# Patient Record
Sex: Female | Born: 1990 | Race: White | Hispanic: No | Marital: Single | Smoking: Never smoker
Health system: Southern US, Community
[De-identification: ages and names within clinical notes are randomized; demographics above are authoritative.]

## PROBLEM LIST (undated history)

## (undated) DIAGNOSIS — D649 Anemia, unspecified: Secondary | ICD-10-CM

## (undated) DIAGNOSIS — G43909 Migraine, unspecified, not intractable, without status migrainosus: Secondary | ICD-10-CM

## (undated) DIAGNOSIS — F329 Major depressive disorder, single episode, unspecified: Secondary | ICD-10-CM

## (undated) DIAGNOSIS — F32A Depression, unspecified: Secondary | ICD-10-CM

## (undated) DIAGNOSIS — J42 Unspecified chronic bronchitis: Secondary | ICD-10-CM

## (undated) DIAGNOSIS — F419 Anxiety disorder, unspecified: Secondary | ICD-10-CM

## (undated) DIAGNOSIS — Z8619 Personal history of other infectious and parasitic diseases: Secondary | ICD-10-CM

## (undated) DIAGNOSIS — M1990 Unspecified osteoarthritis, unspecified site: Secondary | ICD-10-CM

## (undated) DIAGNOSIS — Z8669 Personal history of other diseases of the nervous system and sense organs: Secondary | ICD-10-CM

## (undated) HISTORY — PX: TONSILLECTOMY: SUR1361

## (undated) HISTORY — DX: Migraine, unspecified, not intractable, without status migrainosus: G43.909

## (undated) HISTORY — DX: Unspecified osteoarthritis, unspecified site: M19.90

## (undated) HISTORY — DX: Unspecified chronic bronchitis: J42

## (undated) HISTORY — DX: Anemia, unspecified: D64.9

## (undated) HISTORY — DX: Depression, unspecified: F32.A

## (undated) HISTORY — PX: ADENOIDECTOMY: SUR15

## (undated) HISTORY — PX: TYMPANOSTOMY TUBE PLACEMENT: SHX32

## (undated) HISTORY — DX: Major depressive disorder, single episode, unspecified: F32.9

## (undated) HISTORY — DX: Personal history of other infectious and parasitic diseases: Z86.19

---

## 2012-12-03 ENCOUNTER — Emergency Department (HOSPITAL_BASED_OUTPATIENT_CLINIC_OR_DEPARTMENT_OTHER): Payer: BC Managed Care – PPO

## 2012-12-03 ENCOUNTER — Emergency Department (HOSPITAL_BASED_OUTPATIENT_CLINIC_OR_DEPARTMENT_OTHER)
Admission: EM | Admit: 2012-12-03 | Discharge: 2012-12-03 | Disposition: A | Discharge status: Home / Self Care | Payer: BC Managed Care – PPO | Attending: Emergency Medicine | Admitting: Emergency Medicine

## 2012-12-03 ENCOUNTER — Encounter (HOSPITAL_BASED_OUTPATIENT_CLINIC_OR_DEPARTMENT_OTHER): Payer: Self-pay | Admitting: *Deleted

## 2012-12-03 DIAGNOSIS — M79609 Pain in unspecified limb: Secondary | ICD-10-CM | POA: Insufficient documentation

## 2012-12-03 DIAGNOSIS — R269 Unspecified abnormalities of gait and mobility: Secondary | ICD-10-CM | POA: Insufficient documentation

## 2012-12-03 DIAGNOSIS — M79671 Pain in right foot: Secondary | ICD-10-CM

## 2012-12-03 DIAGNOSIS — Z880 Allergy status to penicillin: Secondary | ICD-10-CM | POA: Insufficient documentation

## 2012-12-03 MED ORDER — OXYCODONE-ACETAMINOPHEN 5-325 MG PO TABS: 1.0000 | ORAL_TABLET | Freq: Four times a day (QID) | ORAL | Status: DC | PRN

## 2012-12-03 NOTE — ED Notes (Signed)
Pt reports injuring (R) foot about a month ago.  Denies ankle pain.  Reports pain in top of foot. Pt ambulatory with a slight limp, states that she has been ace wrapping foot.  No deformity, swelling or bruising noted.

## 2012-12-03 NOTE — ED Provider Notes (Signed)
   History    CSN: [id number] Arrival date & time 12/03/12  4098  First MD Initiated Contact with Patient 12/03/12 [phone]     Chief Complaint  Patient presents with  . Foot Pain   (Consider location/radiation/quality/duration/timing/severity/associated sxs/prior Treatment) Patient is a 22 y.o. female presenting with lower extremity pain. The history is provided by the patient.  Foot Pain   patient with pain in her right foot for the last 2 weeks to month. Getting worse. Worse with walking. Her some swelling. No trauma. No fevers. No rash. History reviewed. No pertinent past medical history. Past Surgical History  Procedure Laterality Date  . Tonsillectomy     History reviewed. No pertinent family history. History  Substance Use Topics  . Smoking status: Never Smoker   . Smokeless tobacco: Not on file  . Alcohol Use: Yes     Comment: socially   OB History   Grav Para Term Preterm Abortions TAB SAB Ect Mult Living                 Review of Systems  Constitutional: Negative for fever.  Musculoskeletal: Positive for gait problem. Negative for myalgias and arthralgias.       Patient states she's had to drag her foot.  Skin: Negative for rash and wound.  Neurological: Negative for weakness and numbness.    Allergies  Penicillins  Home Medications   Current Outpatient Rx  Name  Route  Sig  Dispense  Refill  . oxyCODONE-acetaminophen (PERCOCET/ROXICET) 5-325 MG per tablet   Oral   Take 1-2 tablets by mouth every 6 (six) hours as needed for pain.   15 tablet   0    BP 128/87  Pulse 80  Temp(Src) 98.5 F (36.9 C) (Oral)  Resp 16  Ht 5\' 2"  (1.575 m)  Wt 280 lb (127.007 kg)  BMI 51.2 kg/m2  SpO2 99%  LMP 07/27/2012 Physical Exam  Constitutional:  Patient is obese  HENT:  Head: Normocephalic.  Musculoskeletal: She exhibits tenderness.  Tenderness to the dorsum of right foot near ankle appeared mild swelling. No erythema. No induration. Neurovascular intact  distally. Flexion and extension intact at ankle. Good capillary refill. Palpable dorsalis pedis pulse  Neurological: She is alert.    ED Course  Procedures (including critical care time) Labs Reviewed - No data to display Dg Foot Complete Right  12/03/2012   *RADIOLOGY REPORT*  Clinical Data: Right foot pain  RIGHT FOOT COMPLETE - 3+ VIEW  Comparison: None.  Findings: No fracture or dislocation is noted.  Joint spaces are intact. No soft tissue abnormality is noted.  IMPRESSION: Normal right foot.   Original Report Authenticated By: Lupita Raider.,  M.D.   1. Foot pain, right     MDM  Patient with foot pain. Negative x-ray. Patient was too obese to wear an ASO so Ace wrap was given. Will follow with sports medicine  Juliet Rude. Rubin Payor, MD 12/03/12 [phone]

## 2012-12-04 ENCOUNTER — Encounter: Payer: Self-pay | Admitting: Family Medicine

## 2012-12-04 ENCOUNTER — Ambulatory Visit (INDEPENDENT_AMBULATORY_CARE_PROVIDER_SITE_OTHER): Payer: BC Managed Care – PPO | Admitting: Family Medicine

## 2012-12-04 VITALS — BP 153/103 | HR 85 | Ht 62.0 in | Wt 290.0 lb

## 2012-12-04 DIAGNOSIS — J42 Unspecified chronic bronchitis: Secondary | ICD-10-CM | POA: Insufficient documentation

## 2012-12-04 DIAGNOSIS — F319 Bipolar disorder, unspecified: Secondary | ICD-10-CM | POA: Insufficient documentation

## 2012-12-04 DIAGNOSIS — M79671 Pain in right foot: Secondary | ICD-10-CM

## 2012-12-04 DIAGNOSIS — D649 Anemia, unspecified: Secondary | ICD-10-CM | POA: Insufficient documentation

## 2012-12-04 DIAGNOSIS — M79609 Pain in unspecified limb: Secondary | ICD-10-CM

## 2012-12-04 MED ORDER — HYDROCODONE-ACETAMINOPHEN 5-325 MG PO TABS: 1.0000 | ORAL_TABLET | Freq: Four times a day (QID) | ORAL | Status: DC | PRN

## 2012-12-04 MED ORDER — MELOXICAM 15 MG PO TABS: 15.0000 mg | ORAL_TABLET | Freq: Every day | ORAL | Status: DC

## 2012-12-04 NOTE — Assessment & Plan Note (Signed)
radiographs negative for fracture or other bony abnormality.  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  Start with cam walker (felt very comfortable with this), icing, elevation, meloxicam with norco as needed for severe pain.  Come out of boot to do ROM exercises at least a couple times a day.  Reassess in 1 month.

## 2012-12-04 NOTE — Progress Notes (Signed)
Patient ID: Sheila Donovan, female   DOB: August 02, 1990, 22 y.o.   MRN: [id number]  PCP: No PCP Per Patient  Subjective:   HPI: Patient is a 22 y.o. female here for right foot pain.  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 States was a little sore but pain went away - also felt she dropped this on her toes. It wasn't until a week later that she started developing pain on top of her right foot proximally. Associated with increased swelling, pain with standing and walking - all worse by end of day. Continued to work through this. Has been icing, soaking foot as well. Taking norco as needed. No prior injuries.  Past Medical History  Diagnosis Date  . Chronic bronchitis   . Anemia   . Depression     No current outpatient prescriptions on file prior to visit.   No current facility-administered medications on file prior to visit.    Past Surgical History  Procedure Laterality Date  . Tonsillectomy    . Adenoidectomy    . Tympanostomy tube placement      Allergies  Allergen Reactions  . Penicillins     hives    History   Social History  . Marital Status: Single    Spouse Name: N/A    Number of Children: N/A  . Years of Education: N/A   Occupational History  . Not on file.   Social History Main Topics  . Smoking status: Never Smoker   . Smokeless tobacco: Not on file  . Alcohol Use: Yes     Comment: socially  . Drug Use: No  . Sexually Active: Not on file   Other Topics Concern  . Not on file   Social History Narrative  . No narrative on file    Family History  Problem Relation Age of Onset  . Diabetes Mother   . Hyperlipidemia Mother   . Hypertension Mother   . Heart attack Neg Hx   . Sudden death Neg Hx     BP 153/103  Pulse 85  Ht 5\' 2"  (1.575 m)  Wt 290 lb (131.543 kg)  BMI 53.03 kg/m2  LMP 07/27/2012  Review of Systems: See HPI above.    Objective:  Physical Exam:  Gen:  NAD  R foot/ankle: Mild swelling throughout foot.  No bruising, other deformity. FROM ankle with 5/5 strength all directions - pain greatest with dorsiflexion > plantarflexion reproducing her pain. TTP dorsal foot along extensor tendons.  No malleolar, base 5th, navicular, other foot/ankle TTP. Negative ant drawer and talar tilt.   Negative syndesmotic compression. Thompsons test negative. NV intact distally.    Assessment & Plan:  1. Right foot pain - radiographs negative for fracture or other bony abnormality.  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  Start with cam walker (felt very comfortable with this), icing, elevation, meloxicam with norco as needed for severe pain.  Come out of boot to do ROM exercises at least a couple times a day.  Reassess in 1 month.

## 2012-12-04 NOTE — Patient Instructions (Addendum)
You have an extensor tendinopathy of your right foot/ankle. Rest as much as possible. Icing 15 minutes at a time 3-4 times a day. Elevate above the level of your heart when possible. Soaking in warm water may increase swelling. Meloxicam 15 mg daily with food for pain and inflammation. Norco as needed for severe pain (no driving on this). Wear cam walker for at least a couple weeks. Come out of this a couple times a day to do simple motion exercises - up/downs and alphabet exercises. Follow up with me in about 1 month.

## 2013-01-06 ENCOUNTER — Encounter: Payer: Self-pay | Admitting: Family Medicine

## 2013-01-06 ENCOUNTER — Ambulatory Visit (INDEPENDENT_AMBULATORY_CARE_PROVIDER_SITE_OTHER): Payer: BC Managed Care – PPO | Admitting: Family Medicine

## 2013-01-06 VITALS — BP 156/98 | HR 82 | Ht 62.0 in | Wt 300.0 lb

## 2013-01-06 DIAGNOSIS — M79609 Pain in unspecified limb: Secondary | ICD-10-CM

## 2013-01-06 DIAGNOSIS — M79671 Pain in right foot: Secondary | ICD-10-CM

## 2013-01-06 MED ORDER — TRAMADOL HCL 50 MG PO TABS: 50.0000 mg | ORAL_TABLET | Freq: Three times a day (TID) | ORAL | Status: DC | PRN

## 2013-01-06 NOTE — Patient Instructions (Addendum)
Start physical therapy and do the home exercises on days you don't go. Take meloxicam every day with food. Tramadol as needed for severe pain. Follow up with me in 5-6 weeks for reevaluation.

## 2013-01-07 ENCOUNTER — Encounter: Payer: Self-pay | Admitting: Family Medicine

## 2013-01-07 NOTE — Progress Notes (Signed)
Patient ID: Sheila Donovan, female   DOB: 1991-02-28, 22 y.o.   MRN: [id number]  PCP: No PCP Per Patient  Subjective:   HPI: Patient is a 22 y.o. female here for f/u right foot pain.  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 States was a little sore but pain went away - also felt she dropped this on her toes. It wasn't until a week later that she started developing pain on top of her right foot proximally. Associated with increased swelling, pain with standing and walking - all worse by end of day. Continued to work through this. Has been icing, soaking foot as well. Taking norco as needed. No prior injuries.  8/11: Patient reports she wore the boot for about 4 weeks. Still feels about the same with pain a 7/10. Continues to have the pain on the top of her foot mainly. Takes meloxicam regularly with norco as needed.  Past Medical History  Diagnosis Date  . Chronic bronchitis   . Anemia   . Depression     Current Outpatient Prescriptions on File Prior to Visit  Medication Sig Dispense Refill  . albuterol (PROVENTIL HFA;VENTOLIN HFA) 108 (90 BASE) MCG/ACT inhaler Inhale 2 puffs into the lungs every 6 (six) hours as needed for wheezing.      . meloxicam (MOBIC) 15 MG tablet Take 1 tablet (15 mg total) by mouth daily.  30 tablet  1   No current facility-administered medications on file prior to visit.    Past Surgical History  Procedure Laterality Date  . Tonsillectomy    . Adenoidectomy    . Tympanostomy tube placement      Allergies  Allergen Reactions  . Penicillins     hives    History   Social History  . Marital Status: Single    Spouse Name: N/A    Number of Children: N/A  . Years of Education: N/A   Occupational History  . Not on file.   Social History Main Topics  . Smoking status: Never Smoker   . Smokeless tobacco: Not on file  . Alcohol Use: Yes     Comment: socially  . Drug  Use: No  . Sexually Active: Not on file   Other Topics Concern  . Not on file   Social History Narrative  . No narrative on file    Family History  Problem Relation Age of Onset  . Diabetes Mother   . Hyperlipidemia Mother   . Hypertension Mother   . Heart attack Neg Hx   . Sudden death Neg Hx     BP 156/98  Pulse 82  Ht 5\' 2"  (1.575 m)  Wt 300 lb (136.079 kg)  BMI 54.86 kg/m2  Review of Systems: See HPI above.    Objective:  Physical Exam:  Gen: NAD  R foot/ankle: Mild swelling throughout foot.  No bruising, other deformity. FROM ankle with 5/5 strength all directions - pain greatest with dorsiflexion > plantarflexion reproducing her pain. TTP dorsal foot along extensor tendons.  No malleolar, base 5th, navicular, other foot/ankle TTP. Negative ant drawer and talar tilt.   Negative syndesmotic compression. Thompsons test negative. NV intact distally.    Assessment & Plan:  1. Right foot pain - radiographs negative for fracture or other bony abnormality.  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  Surprised she did not get more relief with cam walker.  Would not expect a stress fracture as she is not physically active (and cam walker should have helped with this).  Discussed options - she will try physical therapy and home exercises as next step.  meloxicam and tramadol.  Consider MRI if still not improving after 5-6 weeks.

## 2013-01-07 NOTE — Assessment & Plan Note (Signed)
radiographs negative for fracture or other bony abnormality.  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  Surprised she did not get more relief with cam walker.  Would not expect a stress fracture as she is not physically active (and cam walker should have helped with this).  Discussed options - she will try physical therapy and home exercises as next step.  meloxicam and tramadol.  Consider MRI if still not improving after 5-6 weeks.

## 2013-01-13 ENCOUNTER — Emergency Department (HOSPITAL_BASED_OUTPATIENT_CLINIC_OR_DEPARTMENT_OTHER)
Admission: EM | Admit: 2013-01-13 | Discharge: 2013-01-13 | Disposition: A | Discharge status: Home / Self Care | Payer: BC Managed Care – PPO | Attending: Emergency Medicine | Admitting: Emergency Medicine

## 2013-01-13 ENCOUNTER — Encounter (HOSPITAL_BASED_OUTPATIENT_CLINIC_OR_DEPARTMENT_OTHER): Payer: Self-pay | Admitting: *Deleted

## 2013-01-13 ENCOUNTER — Other Ambulatory Visit: Payer: Self-pay

## 2013-01-13 DIAGNOSIS — Z862 Personal history of diseases of the blood and blood-forming organs and certain disorders involving the immune mechanism: Secondary | ICD-10-CM | POA: Insufficient documentation

## 2013-01-13 DIAGNOSIS — N938 Other specified abnormal uterine and vaginal bleeding: Secondary | ICD-10-CM | POA: Insufficient documentation

## 2013-01-13 DIAGNOSIS — Z3202 Encounter for pregnancy test, result negative: Secondary | ICD-10-CM | POA: Insufficient documentation

## 2013-01-13 DIAGNOSIS — Z79899 Other long term (current) drug therapy: Secondary | ICD-10-CM | POA: Insufficient documentation

## 2013-01-13 DIAGNOSIS — M545 Low back pain, unspecified: Secondary | ICD-10-CM | POA: Insufficient documentation

## 2013-01-13 DIAGNOSIS — F3289 Other specified depressive episodes: Secondary | ICD-10-CM | POA: Insufficient documentation

## 2013-01-13 DIAGNOSIS — N898 Other specified noninflammatory disorders of vagina: Secondary | ICD-10-CM | POA: Insufficient documentation

## 2013-01-13 DIAGNOSIS — F411 Generalized anxiety disorder: Secondary | ICD-10-CM | POA: Insufficient documentation

## 2013-01-13 DIAGNOSIS — Z8709 Personal history of other diseases of the respiratory system: Secondary | ICD-10-CM | POA: Insufficient documentation

## 2013-01-13 DIAGNOSIS — Z880 Allergy status to penicillin: Secondary | ICD-10-CM | POA: Insufficient documentation

## 2013-01-13 DIAGNOSIS — R109 Unspecified abdominal pain: Secondary | ICD-10-CM | POA: Insufficient documentation

## 2013-01-13 DIAGNOSIS — F329 Major depressive disorder, single episode, unspecified: Secondary | ICD-10-CM | POA: Insufficient documentation

## 2013-01-13 DIAGNOSIS — N949 Unspecified condition associated with female genital organs and menstrual cycle: Secondary | ICD-10-CM | POA: Insufficient documentation

## 2013-01-13 HISTORY — DX: Anxiety disorder, unspecified: F41.9

## 2013-01-13 LAB — URINE MICROSCOPIC-ADD ON

## 2013-01-13 LAB — CBC
Hemoglobin: 11.8 g/dL — ABNORMAL LOW (ref 12.0–15.0)
MCV: 73.6 fL — ABNORMAL LOW (ref 78.0–100.0)
Platelets: 240 10*3/uL (ref 150–400)
RBC: 4.97 MIL/uL (ref 3.87–5.11)
WBC: 9.5 10*3/uL (ref 4.0–10.5)

## 2013-01-13 LAB — WET PREP, GENITAL
Clue Cells Wet Prep HPF POC: NONE SEEN
Trich, Wet Prep: NONE SEEN

## 2013-01-13 LAB — URINALYSIS, ROUTINE W REFLEX MICROSCOPIC
Glucose, UA: NEGATIVE mg/dL
Ketones, ur: 15 mg/dL — AB
Nitrite: NEGATIVE
Specific Gravity, Urine: 1.024 (ref 1.005–1.030)
pH: 5 (ref 5.0–8.0)

## 2013-01-13 LAB — BASIC METABOLIC PANEL
CO2: 25 mEq/L (ref 19–32)
Chloride: 104 mEq/L (ref 96–112)
Creatinine, Ser: 0.7 mg/dL (ref 0.50–1.10)
Glucose, Bld: 92 mg/dL (ref 70–99)

## 2013-01-13 MED ORDER — MEDROXYPROGESTERONE ACETATE 10 MG PO TABS: 10.0000 mg | ORAL_TABLET | Freq: Every day | ORAL | Status: DC

## 2013-01-13 NOTE — ED Notes (Signed)
Patient states she woke up this morning at 0530 and had a right lower back and lower abdominal pain and was covered in vaginal blood.  States she had her normal menstrual cycle approximately two weeks ago.  States she continues to have heavy vaginal bleeding with blood clots.  States she was sitting on the toilet and blew her nose and briefly blacked out and her son was shaking her to wake up and she was lying on the floor.  C/O right lower back pain and lower abdominal pain.

## 2013-01-13 NOTE — ED Provider Notes (Signed)
CSN: [id number]     Arrival date & time 01/13/13  4098 History     First MD Initiated Contact with Patient 01/13/13 [phone]     Chief Complaint  Patient presents with  . Loss of Consciousness   The history is provided by the patient.   patient reports a history of abnormal uterine bleeding.  Her last menstrual cycle was 2 weeks ago.  She woke this morning and had vaginal bleeding and blood in her bed.  This concerned her.  She showered.  She continued to have some heavy bleeding with some blood clots.  She states she was in sitting on a toilet and blowing her nose when she blacked out.  She complains of some lower abdominal cramping and lower back pain.  She states she is currently not 6 reactive.  She denies fevers and chills.  No urinary complaint.  No diarrhea.  She denies that this is rectal bleeding.  She states she's had heavy menstrual cycles before in the past but this is the heaviest.  She does not have an OB/GYN.  Her symptoms are mild to moderate in severity.  No significant pain just some cramping.  Past Medical History  Diagnosis Date  . Chronic bronchitis   . Anemia   . Depression   . Anxiety    Past Surgical History  Procedure Laterality Date  . Tonsillectomy    . Adenoidectomy    . Tympanostomy tube placement     Family History  Problem Relation Age of Onset  . Diabetes Mother   . Hyperlipidemia Mother   . Hypertension Mother   . Heart attack Neg Hx   . Sudden death Neg Hx    History  Substance Use Topics  . Smoking status: Never Smoker   . Smokeless tobacco: Not on file  . Alcohol Use: Yes     Comment: socially   OB History   Grav Para Term Preterm Abortions TAB SAB Ect Mult Living                 Review of Systems  All other systems reviewed and are negative.    Allergies  Penicillins  Home Medications   Current Outpatient Rx  Name  Route  Sig  Dispense  Refill  . albuterol (PROVENTIL HFA;VENTOLIN HFA) 108 (90 BASE) MCG/ACT inhaler    Inhalation   Inhale 2 puffs into the lungs every 6 (six) hours as needed for wheezing.         . medroxyPROGESTERone (PROVERA) 10 MG tablet   Oral   Take 1 tablet (10 mg total) by mouth daily.   10 tablet   0   . meloxicam (MOBIC) 15 MG tablet   Oral   Take 1 tablet (15 mg total) by mouth daily.   30 tablet   1   . traMADol (ULTRAM) 50 MG tablet   Oral   Take 1 tablet (50 mg total) by mouth every 8 (eight) hours as needed for pain.   90 tablet   0    BP 139/93  Pulse 72  Temp(Src) 98.4 F (36.9 C) (Oral)  Resp 20  Ht 5\' 2"  (1.575 m)  Wt 300 lb (136.079 kg)  BMI 54.86 kg/m2  SpO2 100%  LMP 12/30/2012 Physical Exam  Nursing note and vitals reviewed. Constitutional: She is oriented to person, place, and time. She appears well-developed and well-nourished. No distress.  HENT:  Head: Normocephalic and atraumatic.  Eyes: EOM are normal.  Neck: Normal  range of motion.  Cardiovascular: Normal rate, regular rhythm and normal heart sounds.   Pulmonary/Chest: Effort normal and breath sounds normal.  Abdominal: Soft. She exhibits no distension. There is no tenderness.  Morbidly obese  Genitourinary:  Normal external genitalia.  Small amount of blood coming from the cervical os.  Cervical os is closed.  No adnexal masses or fullness.  No uterine masses palpated.  No cervical motion tenderness.  No vaginal lacerations.  Small amount of blood in the vaginal vault.  Musculoskeletal: Normal range of motion.  Neurological: She is alert and oriented to person, place, and time.  Skin: Skin is warm and dry. No pallor.  Psychiatric: She has a normal mood and affect. Judgment normal.    ED Course   Procedures (including critical care time)   Date: 01/13/2013  Rate: 76  Rhythm: normal sinus rhythm  QRS Axis: normal  Intervals: normal  ST/T Wave abnormalities: normal  Conduction Disutrbances: none  Narrative Interpretation:   Old EKG Reviewed: no prior ecg     Labs  Reviewed  URINALYSIS, ROUTINE W REFLEX MICROSCOPIC - Abnormal; Notable for the following:    Color, Urine RED (*)    APPearance TURBID (*)    Hgb urine dipstick LARGE (*)    Bilirubin Urine MODERATE (*)    Ketones, ur 15 (*)    Protein, ur 100 (*)    Leukocytes, UA MODERATE (*)    All other components within normal limits  CBC - Abnormal; Notable for the following:    Hemoglobin 11.8 (*)    MCV 73.6 (*)    MCH 23.7 (*)    RDW 16.8 (*)    All other components within normal limits  URINE MICROSCOPIC-ADD ON - Abnormal; Notable for the following:    Bacteria, UA MANY (*)    All other components within normal limits  GC/CHLAMYDIA PROBE AMP  WET PREP, GENITAL  URINE CULTURE  PREGNANCY, URINE  BASIC METABOLIC PANEL   No results found. 1. DUB (dysfunctional uterine bleeding)     MDM  Her hemoglobin is 11.8.  Vital signs are normal.  No signs of shock.  Urine pregnancy test is negative.  Small amount of ongoing vaginal bleeding. This appears to be dysfunctional uterine ble pelvic exam.  Patient will be prescribed a 10 day course of Provera.  I've instructed the patient to followup with gynecology.  She understands to return to the ER for new or worsening symptoms.  She was [facility] about [facility] in [place] if her symptoms are worsening but she was also told that she is welcome to return to this emergency department if she would prefer.    Lyanne Co, MD 01/13/13 [phone]

## 2013-01-14 ENCOUNTER — Ambulatory Visit: Payer: BC Managed Care – PPO | Attending: Family Medicine | Admitting: Rehabilitation

## 2013-01-14 DIAGNOSIS — M25579 Pain in unspecified ankle and joints of unspecified foot: Secondary | ICD-10-CM | POA: Insufficient documentation

## 2013-01-14 DIAGNOSIS — IMO0001 Reserved for inherently not codable concepts without codable children: Secondary | ICD-10-CM | POA: Insufficient documentation

## 2013-01-14 LAB — URINE CULTURE: Colony Count: 75000

## 2013-01-14 LAB — GC/CHLAMYDIA PROBE AMP
CT Probe RNA: NEGATIVE
GC Probe RNA: NEGATIVE

## 2013-01-22 ENCOUNTER — Ambulatory Visit: Payer: BC Managed Care – PPO | Admitting: Rehabilitation

## 2013-01-28 ENCOUNTER — Ambulatory Visit: Payer: BC Managed Care – PPO | Admitting: Rehabilitation

## 2013-01-30 ENCOUNTER — Ambulatory Visit: Payer: BC Managed Care – PPO | Admitting: Rehabilitation

## 2013-02-04 ENCOUNTER — Ambulatory Visit: Payer: BC Managed Care – PPO | Attending: Family Medicine | Admitting: Rehabilitation

## 2013-02-04 DIAGNOSIS — IMO0001 Reserved for inherently not codable concepts without codable children: Secondary | ICD-10-CM | POA: Insufficient documentation

## 2013-02-04 DIAGNOSIS — M25579 Pain in unspecified ankle and joints of unspecified foot: Secondary | ICD-10-CM | POA: Insufficient documentation

## 2013-02-06 ENCOUNTER — Ambulatory Visit: Payer: BC Managed Care – PPO | Admitting: Rehabilitation

## 2013-02-11 ENCOUNTER — Ambulatory Visit: Payer: BC Managed Care – PPO | Admitting: Rehabilitation

## 2013-02-12 ENCOUNTER — Ambulatory Visit: Payer: BC Managed Care – PPO | Admitting: Family Medicine

## 2013-02-13 ENCOUNTER — Ambulatory Visit: Payer: BC Managed Care – PPO | Admitting: Rehabilitation

## 2013-04-03 ENCOUNTER — Other Ambulatory Visit: Payer: Self-pay

## 2013-08-05 ENCOUNTER — Emergency Department (HOSPITAL_BASED_OUTPATIENT_CLINIC_OR_DEPARTMENT_OTHER)
Admission: EM | Admit: 2013-08-05 | Discharge: 2013-08-05 | Disposition: A | Discharge status: Home / Self Care | Payer: Medicaid Other | Attending: Emergency Medicine | Admitting: Emergency Medicine

## 2013-08-05 ENCOUNTER — Encounter (HOSPITAL_BASED_OUTPATIENT_CLINIC_OR_DEPARTMENT_OTHER): Payer: Self-pay | Admitting: Emergency Medicine

## 2013-08-05 DIAGNOSIS — F3289 Other specified depressive episodes: Secondary | ICD-10-CM | POA: Insufficient documentation

## 2013-08-05 DIAGNOSIS — J069 Acute upper respiratory infection, unspecified: Secondary | ICD-10-CM | POA: Insufficient documentation

## 2013-08-05 DIAGNOSIS — Z791 Long term (current) use of non-steroidal anti-inflammatories (NSAID): Secondary | ICD-10-CM | POA: Insufficient documentation

## 2013-08-05 DIAGNOSIS — Z79899 Other long term (current) drug therapy: Secondary | ICD-10-CM | POA: Insufficient documentation

## 2013-08-05 DIAGNOSIS — Z862 Personal history of diseases of the blood and blood-forming organs and certain disorders involving the immune mechanism: Secondary | ICD-10-CM | POA: Insufficient documentation

## 2013-08-05 DIAGNOSIS — F329 Major depressive disorder, single episode, unspecified: Secondary | ICD-10-CM | POA: Insufficient documentation

## 2013-08-05 DIAGNOSIS — H9209 Otalgia, unspecified ear: Secondary | ICD-10-CM | POA: Insufficient documentation

## 2013-08-05 DIAGNOSIS — F411 Generalized anxiety disorder: Secondary | ICD-10-CM | POA: Insufficient documentation

## 2013-08-05 DIAGNOSIS — Z880 Allergy status to penicillin: Secondary | ICD-10-CM | POA: Insufficient documentation

## 2013-08-05 DIAGNOSIS — Z8709 Personal history of other diseases of the respiratory system: Secondary | ICD-10-CM | POA: Insufficient documentation

## 2013-08-05 HISTORY — DX: Personal history of other diseases of the nervous system and sense organs: Z86.69

## 2013-08-05 MED ORDER — GUAIFENESIN-CODEINE 100-10 MG/5ML PO SOLN: 10.0000 mL | Freq: Four times a day (QID) | ORAL | Status: DC | PRN

## 2013-08-05 NOTE — ED Provider Notes (Signed)
CSN: [id number]     Arrival date & time 08/05/13  1347 History   First MD Initiated Contact with Patient 08/05/13 1407     Chief Complaint  Patient presents with  . URI     (Consider location/radiation/quality/duration/timing/severity/associated sxs/prior Treatment) Patient is a 23 y.o. female presenting with URI. The history is provided by the patient.  URI Presenting symptoms: congestion, cough and ear pain   Severity:  Moderate Onset quality:  Gradual Duration:  3 days Timing:  Constant Progression:  Worsening Chronicity:  New Relieved by:  Nothing Worsened by:  Nothing tried Ineffective treatments:  None tried   Past Medical History  Diagnosis Date  . Chronic bronchitis   . Anemia   . Depression   . Anxiety   . History of ear infections    Past Surgical History  Procedure Laterality Date  . Tonsillectomy    . Adenoidectomy    . Tympanostomy tube placement     Family History  Problem Relation Age of Onset  . Diabetes Mother   . Hyperlipidemia Mother   . Hypertension Mother   . Heart attack Neg Hx   . Sudden death Neg Hx    History  Substance Use Topics  . Smoking status: Never Smoker   . Smokeless tobacco: Not on file  . Alcohol Use: Yes   OB History   Grav Para Term Preterm Abortions TAB SAB Ect Mult Living                 Review of Systems  HENT: Positive for congestion and ear pain.   Respiratory: Positive for cough.   All other systems reviewed and are negative.      Allergies  Penicillins  Home Medications   Current Outpatient Rx  Name  Route  Sig  Dispense  Refill  . albuterol (PROVENTIL HFA;VENTOLIN HFA) 108 (90 BASE) MCG/ACT inhaler   Inhalation   Inhale 2 puffs into the lungs every 6 (six) hours as needed for wheezing.         . medroxyPROGESTERone (PROVERA) 10 MG tablet   Oral   Take 1 tablet (10 mg total) by mouth daily.   10 tablet   0   . meloxicam (MOBIC) 15 MG tablet   Oral   Take 1 tablet (15 mg total) by mouth  daily.   30 tablet   1   . traMADol (ULTRAM) 50 MG tablet   Oral   Take 1 tablet (50 mg total) by mouth every 8 (eight) hours as needed for pain.   90 tablet   0    BP 190/106  Pulse 80  Temp(Src) 98.2 F (36.8 C) (Oral)  Resp 20  Ht 5\' 2"  (1.575 m)  Wt 340 lb (154.223 kg)  BMI 62.17 kg/m2  SpO2 100%  LMP 07/08/2013 Physical Exam  Nursing note and vitals reviewed. Constitutional: She is oriented to person, place, and time. She appears well-developed and well-nourished. No distress.  HENT:  Head: Normocephalic and atraumatic.  Mouth/Throat: Oropharynx is clear and moist.  TMs are clear bilaterally.  Neck: Normal range of motion. Neck supple.  Cardiovascular: Normal rate and regular rhythm.  Exam reveals no gallop and no friction rub.   No murmur heard. Pulmonary/Chest: Effort normal and breath sounds normal. No respiratory distress. She has no wheezes.  Abdominal: Soft. Bowel sounds are normal. She exhibits no distension. There is no tenderness.  Musculoskeletal: Normal range of motion. She exhibits no edema.  Lymphadenopathy:  She has no cervical adenopathy.  Neurological: She is alert and oriented to person, place, and time.  Skin: Skin is warm and dry. She is not diaphoretic.    ED Course  Procedures (including critical care time) Labs Review Labs Reviewed - No data to display Imaging Review No results found.   EKG Interpretation None      MDM   Final diagnoses:  None    Symptoms most likely viral in nature. There is no hypoxia and lungs are clear. There is no evidence for otitis media. We'll recommend over-the-counter medications and when necessary followup. I will prescribe Robitussin with codeine to be used as needed for cough.    Geoffery Lyonsouglas Allen Egerton, MD 08/05/13 [phone]

## 2013-08-05 NOTE — ED Notes (Signed)
C/o cough, sore throat, right ear ache x 3 days

## 2013-08-05 NOTE — Discharge Instructions (Signed)

## 2013-08-19 ENCOUNTER — Emergency Department (HOSPITAL_BASED_OUTPATIENT_CLINIC_OR_DEPARTMENT_OTHER): Payer: Medicaid Other

## 2013-08-19 ENCOUNTER — Emergency Department (HOSPITAL_BASED_OUTPATIENT_CLINIC_OR_DEPARTMENT_OTHER)
Admission: EM | Admit: 2013-08-19 | Discharge: 2013-08-19 | Disposition: A | Discharge status: Home / Self Care | Payer: Medicaid Other | Attending: Emergency Medicine | Admitting: Emergency Medicine

## 2013-08-19 ENCOUNTER — Encounter (HOSPITAL_BASED_OUTPATIENT_CLINIC_OR_DEPARTMENT_OTHER): Payer: Self-pay | Admitting: Emergency Medicine

## 2013-08-19 DIAGNOSIS — F329 Major depressive disorder, single episode, unspecified: Secondary | ICD-10-CM | POA: Insufficient documentation

## 2013-08-19 DIAGNOSIS — Z880 Allergy status to penicillin: Secondary | ICD-10-CM | POA: Insufficient documentation

## 2013-08-19 DIAGNOSIS — F3289 Other specified depressive episodes: Secondary | ICD-10-CM | POA: Insufficient documentation

## 2013-08-19 DIAGNOSIS — Z8669 Personal history of other diseases of the nervous system and sense organs: Secondary | ICD-10-CM | POA: Insufficient documentation

## 2013-08-19 DIAGNOSIS — Z79899 Other long term (current) drug therapy: Secondary | ICD-10-CM | POA: Insufficient documentation

## 2013-08-19 DIAGNOSIS — B9789 Other viral agents as the cause of diseases classified elsewhere: Secondary | ICD-10-CM

## 2013-08-19 DIAGNOSIS — Z791 Long term (current) use of non-steroidal anti-inflammatories (NSAID): Secondary | ICD-10-CM | POA: Insufficient documentation

## 2013-08-19 DIAGNOSIS — J069 Acute upper respiratory infection, unspecified: Secondary | ICD-10-CM | POA: Insufficient documentation

## 2013-08-19 DIAGNOSIS — F411 Generalized anxiety disorder: Secondary | ICD-10-CM | POA: Insufficient documentation

## 2013-08-19 DIAGNOSIS — Z8709 Personal history of other diseases of the respiratory system: Secondary | ICD-10-CM | POA: Insufficient documentation

## 2013-08-19 DIAGNOSIS — Z862 Personal history of diseases of the blood and blood-forming organs and certain disorders involving the immune mechanism: Secondary | ICD-10-CM | POA: Insufficient documentation

## 2013-08-19 LAB — CBC WITH DIFFERENTIAL/PLATELET
Basophils Absolute: 0 10*3/uL (ref 0.0–0.1)
Basophils Relative: 0 % (ref 0–1)
EOS ABS: 0.2 10*3/uL (ref 0.0–0.7)
EOS PCT: 2 % (ref 0–5)
HEMATOCRIT: 41.1 % (ref 36.0–46.0)
HEMOGLOBIN: 12.8 g/dL (ref 12.0–15.0)
LYMPHS ABS: 2.4 10*3/uL (ref 0.7–4.0)
LYMPHS PCT: 25 % (ref 12–46)
MCH: 23.5 pg — AB (ref 26.0–34.0)
MCHC: 31.1 g/dL (ref 30.0–36.0)
MCV: 75.4 fL — AB (ref 78.0–100.0)
MONO ABS: 0.6 10*3/uL (ref 0.1–1.0)
MONOS PCT: 7 % (ref 3–12)
Neutro Abs: 6.3 10*3/uL (ref 1.7–7.7)
Neutrophils Relative %: 66 % (ref 43–77)
PLATELETS: 238 10*3/uL (ref 150–400)
RBC: 5.45 MIL/uL — AB (ref 3.87–5.11)
RDW: 16.4 % — ABNORMAL HIGH (ref 11.5–15.5)
WBC: 9.6 10*3/uL (ref 4.0–10.5)

## 2013-08-19 LAB — RAPID STREP SCREEN (MED CTR MEBANE ONLY): Streptococcus, Group A Screen (Direct): NEGATIVE

## 2013-08-19 MED ORDER — SODIUM CHLORIDE 0.9 % IV BOLUS (SEPSIS)
1000.0000 mL | Freq: Once | INTRAVENOUS | Status: AC
  Administered 2013-08-19: 1000 mL via INTRAVENOUS

## 2013-08-19 MED ORDER — BENZONATATE 100 MG PO CAPS: 200.0000 mg | ORAL_CAPSULE | Freq: Two times a day (BID) | ORAL | Status: DC

## 2013-08-19 MED ORDER — BENZONATATE 100 MG PO CAPS
200.0000 mg | ORAL_CAPSULE | Freq: Once | ORAL | Status: AC
  Administered 2013-08-19: 200 mg via ORAL
  Filled 2013-08-19: qty 2

## 2013-08-19 MED ORDER — ONDANSETRON HCL 4 MG/2ML IJ SOLN
4.0000 mg | Freq: Once | INTRAMUSCULAR | Status: AC
  Administered 2013-08-19: 4 mg via INTRAVENOUS
  Filled 2013-08-19: qty 2

## 2013-08-19 MED ORDER — PREDNISONE 50 MG PO TABS: 50.0000 mg | ORAL_TABLET | Freq: Every day | ORAL | Status: DC

## 2013-08-19 MED ORDER — ALBUTEROL SULFATE (2.5 MG/3ML) 0.083% IN NEBU
2.5000 mg | INHALATION_SOLUTION | Freq: Once | RESPIRATORY_TRACT | Status: AC
Start: 2013-08-19 — End: 2013-08-19
  Administered 2013-08-19: 2.5 mg via RESPIRATORY_TRACT
  Filled 2013-08-19: qty 3

## 2013-08-19 NOTE — ED Notes (Signed)
Resident at bedside. Pt demanding "an inhaler". Resident explaining to pt that she does not feel an inhaled steroid is needed at this time.

## 2013-08-19 NOTE — ED Notes (Signed)
Patient transported to X-ray 

## 2013-08-19 NOTE — ED Notes (Signed)
Pt amb to triage with quick steady gait in nad. Pt reports cough and subjective temps x 2 days. Pt states cough produces yellow sputum. Pt states she does not have a pcp.

## 2013-08-19 NOTE — ED Provider Notes (Signed)
I saw and evaluated the patient, reviewed the resident's note and I agree with the findings and plan. Patient is a 23 year old female presents to the ER with complaints of URI type symptoms. Her cough has been productive of yellow phlegm. This started 2 days ago. Temperatures at home have been up to 102. She was seen here for similar complaints 2 weeks ago however the symptoms resolved. They returned again 2 days ago.  On exam, vitals are stable the patient is afebrile. Head is atraumatic normocephalic. Neck is supple. Heart is regular rate and rhythm. Lungs are clear. Abdomen is benign.  Workup reveals no acute abnormality in the laboratory studies and chest x-ray. She is feeling better with the breathing treatment and appears appropriate for discharge. Symptoms are likely viral in nature. She is to followup if not improving in one week and return if her symptoms worsen or change.   EKG Interpretation None        Geoffery Lyonsouglas Lorik Guo, MD 08/19/13 1221

## 2013-08-19 NOTE — ED Provider Notes (Signed)
CSN: [id number]     Arrival date & time 08/19/13  [id number] History   First MD Initiated Contact with Patient 08/19/13 [phone]     Chief Complaint  Patient presents with  . Cough   HPI Edgar FriskBrittney Bovenzi is 23 y.o. female, presented to the ED with complaints of cough, vomit, fever, headache and sore throat and fatigue. Patient reports her cough has been productive and produces yellow phlegm. She endorses nausea and lack of appetite as well. Start of her symptoms started about one half days ago. She endorses a fever of approximately 102 to Fahrenheit. She denies vomiting and diarrhea. He has not been exposed to any sick contacts. She did get her flu shot this year. Patient is not a smoker.  Past Medical History  Diagnosis Date  . Chronic bronchitis   . Anemia   . Depression   . Anxiety   . History of ear infections    Past Surgical History  Procedure Laterality Date  . Tonsillectomy    . Adenoidectomy    . Tympanostomy tube placement     Family History  Problem Relation Age of Onset  . Diabetes Mother   . Hyperlipidemia Mother   . Hypertension Mother   . Heart attack Neg Hx   . Sudden death Neg Hx    History  Substance Use Topics  . Smoking status: Never Smoker   . Smokeless tobacco: Not on file  . Alcohol Use: Yes   OB History   Grav Para Term Preterm Abortions TAB SAB Ect Mult Living                 Review of Systems  Constitutional: Positive for fever, chills, activity change, appetite change and fatigue.  HENT: Positive for congestion, rhinorrhea, sinus pressure and sore throat. Negative for ear discharge, ear pain, facial swelling, postnasal drip and sneezing.   Eyes: Negative for pain, discharge and itching.  Respiratory: Positive for cough, chest tightness, shortness of breath and wheezing.   Cardiovascular: Negative for chest pain and palpitations.  Gastrointestinal: Positive for nausea. Negative for vomiting, abdominal pain, diarrhea and constipation.  Genitourinary:  Negative for dysuria, frequency, hematuria and flank pain.  Musculoskeletal: Positive for myalgias. Negative for arthralgias.  Skin: Negative for rash.  Neurological: Positive for headaches.   Allergies  Penicillins  Home Medications   Current Outpatient Rx  Name  Route  Sig  Dispense  Refill  . albuterol (PROVENTIL HFA;VENTOLIN HFA) 108 (90 BASE) MCG/ACT inhaler   Inhalation   Inhale 2 puffs into the lungs every 6 (six) hours as needed for wheezing.         . benzonatate (TESSALON PERLES) 100 MG capsule   Oral   Take 2 capsules (200 mg total) by mouth 2 (two) times daily.   20 capsule   0   . guaiFENesin-codeine 100-10 MG/5ML syrup   Oral   Take 10 mLs by mouth every 6 (six) hours as needed for cough.   120 mL   0   . medroxyPROGESTERone (PROVERA) 10 MG tablet   Oral   Take 1 tablet (10 mg total) by mouth daily.   10 tablet   0   . meloxicam (MOBIC) 15 MG tablet   Oral   Take 1 tablet (15 mg total) by mouth daily.   30 tablet   1   . predniSONE (DELTASONE) 50 MG tablet   Oral   Take 1 tablet (50 mg total) by mouth daily with breakfast.  4 tablet   0   . traMADol (ULTRAM) 50 MG tablet   Oral   Take 1 tablet (50 mg total) by mouth every 8 (eight) hours as needed for pain.   90 tablet   0    BP 163/79  Pulse 86  Temp(Src) 98.2 F (36.8 C) (Oral)  Resp 22  Ht 5\' 2"  (1.575 m)  Wt 320 lb (145.151 kg)  BMI 58.51 kg/m2  SpO2 99%  LMP 07/25/2013 Physical Exam Gen: NAD. Persistent cough HEENT: AT. [place]. Bilateral TM visualized and normal in appearance. Bilateral eyes without injections or icterus. MMM. Bilateral nares with mild erythema no swelling. Throat without erythema or exudates.  CV: RRR , no murmurs click gallops or rubs Chest: CTAB, no wheeze or crackles. Normal work of breath Abd: Soft. NTND. BS present. No Masses palpated.  Ext: No erythema. No edema.  Skin: No rashes, purpura or petechiae.  Neuro:  Normal gait. PERLA. EOMi. Alert. Grossly  intact.  Psych: Normal affect, dress, and demeanor. Normal speech  ED Course  Procedures (including critical care time) Labs Review Labs Reviewed  CBC WITH DIFFERENTIAL - Abnormal; Notable for the following:    RBC 5.45 (*)    MCV 75.4 (*)    MCH 23.5 (*)    RDW 16.4 (*)    All other components within normal limits  RAPID STREP SCREEN  PREGNANCY, URINE   Imaging Review Dg Chest 2 View  08/19/2013   CLINICAL DATA:  Cough, congestion  EXAM: CHEST  2 VIEW  COMPARISON:  None.  FINDINGS: Cardiomediastinal silhouette is unremarkable. No acute infiltrate or pulmonary edema. Bony thorax is unremarkable.  IMPRESSION: No active cardiopulmonary disease.   Electronically Signed   By: Natasha Mead M.D.   On: 08/19/2013 08:52     EKG Interpretation None      MDM   Final diagnoses:  Viral URI with cough   Patient presented to the ED with upper respiratory infection symptoms with cough or 1-1/2 days. She appeared in no acute distress and nontoxic. She was afebrile on admission without elevation in white blood cell count. Chest x-ray was normal. Patient was giving an albuterol treatment, Tessalon Perles for cough and Zofran for nausea. IV fluid bolus. Patient responded well with treatment. Patient was given prescriptions for Tessalon Perles and prednisone. Stable for discharge with close followup with PCP if symptoms worsen. She was giving information for available PCP in the area, to start her primary care.    Natalia Leatherwood, DO 08/19/13 [phone]

## 2013-08-19 NOTE — ED Notes (Signed)
Pt requests to speak with resident. "I have many questions she has not answered!" resident alerted.

## 2013-08-19 NOTE — ED Notes (Signed)
Pt angry, states "I am calling my lawyer. I am going to sue her. I need an inhaler, I've had one for the last 6 years and I know I need one."

## 2013-08-19 NOTE — Discharge Instructions (Signed)

## 2014-01-21 ENCOUNTER — Emergency Department (HOSPITAL_BASED_OUTPATIENT_CLINIC_OR_DEPARTMENT_OTHER): Payer: Medicaid Other

## 2014-01-21 ENCOUNTER — Emergency Department (HOSPITAL_BASED_OUTPATIENT_CLINIC_OR_DEPARTMENT_OTHER)
Admission: EM | Admit: 2014-01-21 | Discharge: 2014-01-21 | Disposition: A | Discharge status: Home / Self Care | Payer: Medicaid Other | Attending: Emergency Medicine | Admitting: Emergency Medicine

## 2014-01-21 ENCOUNTER — Encounter (HOSPITAL_BASED_OUTPATIENT_CLINIC_OR_DEPARTMENT_OTHER): Payer: Self-pay | Admitting: Emergency Medicine

## 2014-01-21 DIAGNOSIS — F411 Generalized anxiety disorder: Secondary | ICD-10-CM | POA: Diagnosis not present

## 2014-01-21 DIAGNOSIS — Z862 Personal history of diseases of the blood and blood-forming organs and certain disorders involving the immune mechanism: Secondary | ICD-10-CM | POA: Insufficient documentation

## 2014-01-21 DIAGNOSIS — F3289 Other specified depressive episodes: Secondary | ICD-10-CM | POA: Insufficient documentation

## 2014-01-21 DIAGNOSIS — IMO0002 Reserved for concepts with insufficient information to code with codable children: Secondary | ICD-10-CM | POA: Insufficient documentation

## 2014-01-21 DIAGNOSIS — M25539 Pain in unspecified wrist: Secondary | ICD-10-CM | POA: Diagnosis present

## 2014-01-21 DIAGNOSIS — Z8709 Personal history of other diseases of the respiratory system: Secondary | ICD-10-CM | POA: Diagnosis not present

## 2014-01-21 DIAGNOSIS — G5600 Carpal tunnel syndrome, unspecified upper limb: Secondary | ICD-10-CM | POA: Insufficient documentation

## 2014-01-21 DIAGNOSIS — Z791 Long term (current) use of non-steroidal anti-inflammatories (NSAID): Secondary | ICD-10-CM | POA: Insufficient documentation

## 2014-01-21 DIAGNOSIS — F329 Major depressive disorder, single episode, unspecified: Secondary | ICD-10-CM | POA: Insufficient documentation

## 2014-01-21 DIAGNOSIS — Z880 Allergy status to penicillin: Secondary | ICD-10-CM | POA: Diagnosis not present

## 2014-01-21 DIAGNOSIS — Z79899 Other long term (current) drug therapy: Secondary | ICD-10-CM | POA: Insufficient documentation

## 2014-01-21 DIAGNOSIS — M25531 Pain in right wrist: Secondary | ICD-10-CM

## 2014-01-21 DIAGNOSIS — G5601 Carpal tunnel syndrome, right upper limb: Secondary | ICD-10-CM

## 2014-01-21 MED ORDER — HYDROCODONE-ACETAMINOPHEN 5-325 MG PO TABS: 1.0000 | ORAL_TABLET | Freq: Four times a day (QID) | ORAL | Status: DC | PRN

## 2014-01-21 MED ORDER — NAPROXEN 500 MG PO TABS: 500.0000 mg | ORAL_TABLET | Freq: Two times a day (BID) | ORAL | Status: DC

## 2014-01-21 NOTE — ED Notes (Signed)
Reports numbness and tingling to right wrist since Monday. Also reports bruise to wrist. Cap refill < 2 seconds, pulses present

## 2014-01-21 NOTE — Discharge Instructions (Signed)
Keep the splint in place. Can remove for showering. Take the Naprosyn as directed. Supplement with hydrocodone as needed for additional help with pain. Make an appointment to followup with hand surgery. X-rays of the wrist were negative.

## 2014-01-21 NOTE — ED Provider Notes (Signed)
CSN: [id number]     Arrival date & time 01/21/14  1612 History   First MD Initiated Contact with Patient 01/21/14 1622     Chief Complaint  Patient presents with  . Wrist Pain     (Consider location/radiation/quality/duration/timing/severity/associated sxs/prior Treatment) Patient is a 23 y.o. female presenting with wrist pain. The history is provided by the patient.  Wrist Pain Pertinent negatives include no chest pain, no abdominal pain, no headaches and no shortness of breath.  patient without any direct injury to the right wrist. 2 days ago started with discomfort and swelling in the right wrist area. Now has numbness to the palm index finger and middle finger. Patient denies any repetitive type process or injury. Patient has not had pain like this before. Patient noted increased swelling to the anterior part of the rest. Patient's right-hand dominant.  Past Medical History  Diagnosis Date  . Chronic bronchitis   . Anemia   . Depression   . Anxiety   . History of ear infections    Past Surgical History  Procedure Laterality Date  . Tonsillectomy    . Adenoidectomy    . Tympanostomy tube placement     Family History  Problem Relation Age of Onset  . Diabetes Mother   . Hyperlipidemia Mother   . Hypertension Mother   . Heart attack Neg Hx   . Sudden death Neg Hx    History  Substance Use Topics  . Smoking status: Never Smoker   . Smokeless tobacco: Not on file  . Alcohol Use: Yes   OB History   Grav Para Term Preterm Abortions TAB SAB Ect Mult Living                 Review of Systems  Constitutional: Negative for fever.  HENT: Negative for congestion.   Eyes: Negative for redness.  Respiratory: Negative for shortness of breath.   Cardiovascular: Negative for chest pain.  Gastrointestinal: Negative for nausea, vomiting and abdominal pain.  Musculoskeletal: Negative for neck pain.  Skin: Negative for wound.  Neurological: Positive for numbness. Negative for  headaches.  Hematological: Does not bruise/bleed easily.      Allergies  Penicillins  Home Medications   Prior to Admission medications   Medication Sig Start Date End Date Taking? Authorizing Provider  albuterol (PROVENTIL HFA;VENTOLIN HFA) 108 (90 BASE) MCG/ACT inhaler Inhale 2 puffs into the lungs every 6 (six) hours as needed for wheezing.    Historical Provider, MD  benzonatate (TESSALON PERLES) 100 MG capsule Take 2 capsules (200 mg total) by mouth 2 (two) times daily. 08/19/13   Renee A Kuneff, DO  guaiFENesin-codeine 100-10 MG/5ML syrup Take 10 mLs by mouth every 6 (six) hours as needed for cough. 08/05/13   Geoffery Lyons, MD  HYDROcodone-acetaminophen (NORCO/VICODIN) 5-325 MG per tablet Take 1-2 tablets by mouth every 6 (six) hours as needed for moderate pain. 01/21/14   Vanetta Mulders, MD  medroxyPROGESTERone (PROVERA) 10 MG tablet Take 1 tablet (10 mg total) by mouth daily. 01/13/13   Lyanne Co, MD  meloxicam (MOBIC) 15 MG tablet Take 1 tablet (15 mg total) by mouth daily. 12/04/12   Lenda Kelp, MD  naproxen (NAPROSYN) 500 MG tablet Take 1 tablet (500 mg total) by mouth 2 (two) times daily. 01/21/14   Vanetta Mulders, MD  predniSONE (DELTASONE) 50 MG tablet Take 1 tablet (50 mg total) by mouth daily with breakfast. 08/19/13   Renee A Kuneff, DO  traMADol (ULTRAM) 50 MG  tablet Take 1 tablet (50 mg total) by mouth every 8 (eight) hours as needed for pain. 01/06/13   Lenda Kelp, MD   BP 169/99  Pulse 98  Temp(Src) 97.8 F (36.6 C) (Oral)  Resp 18  Ht  (1.575 m)  Wt 329 lb (149.233 kg)  BMI 60.16 kg/m2  SpO2 99% Physical Exam  Nursing note and vitals reviewed. Constitutional: She is oriented to person, place, and time. She appears well-developed and well-nourished. No distress.  HENT:  Head: Normocephalic and atraumatic.  Mouth/Throat: Oropharynx is clear and moist.  Eyes: Conjunctivae and EOM are normal. Pupils are equal, round, and reactive to light.  Neck:  Normal range of motion.  Cardiovascular: Normal rate, regular rhythm and normal heart sounds.   Pulmonary/Chest: Effort normal and breath sounds normal. No respiratory distress.  Abdominal: Soft. Bowel sounds are normal.  Musculoskeletal:  Swelling to the anterior portion of the right wrist probable early ganglion cyst development. Sensations decreased with some numbness to the palm index finger and middle finger. Ring finger and little finger sensation is normal. Good range of motion. Increased pain with palpation to the carpal tunnel area. Cap refill is 2 seconds and radial pulse is 2+.  Neurological: She is alert and oriented to person, place, and time. No cranial nerve deficit. She exhibits normal muscle tone. Coordination normal.    ED Course  Procedures (including critical care time) Labs Review Labs Reviewed - No data to display  Imaging Review Dg Wrist Complete Right  01/21/2014   CLINICAL DATA:  Right wrist swelling, numbness and tingling for 3 days.  EXAM: RIGHT WRIST - COMPLETE 3+ VIEW  COMPARISON:  None.  FINDINGS: Imaged bones, joints and soft tissues appear normal.  IMPRESSION: Normal study.   Electronically Signed   By: Drusilla Kanner M.D.   On: 01/21/2014 17:09     EKG Interpretation None      MDM   Final diagnoses:  Wrist pain, acute, right  Carpal tunnel syndrome on right    No history of injury to pain to the right wrist with swelling and some carpal tunnel-type sent comes. Capillary refill is intact good range of motion of the fingers. There is some swelling to the anterior portion of the wrist. X-rays are negative. Suspect the swelling in the wrist area resulting in carpal tunnel-type symptoms. We'll treat with a splint and followup with hand surgery. Patient denies any new repetitive type process or injury.    Vanetta Mulders, MD 01/21/14 1728

## 2014-06-08 ENCOUNTER — Observation Stay (HOSPITAL_BASED_OUTPATIENT_CLINIC_OR_DEPARTMENT_OTHER)
Admission: EM | Admit: 2014-06-08 | Discharge: 2014-06-10 | Disposition: A | Discharge status: Home / Self Care | Payer: Medicaid Other | Attending: General Surgery | Admitting: General Surgery

## 2014-06-08 ENCOUNTER — Emergency Department (HOSPITAL_BASED_OUTPATIENT_CLINIC_OR_DEPARTMENT_OTHER): Payer: Medicaid Other

## 2014-06-08 ENCOUNTER — Encounter (HOSPITAL_BASED_OUTPATIENT_CLINIC_OR_DEPARTMENT_OTHER): Payer: Self-pay | Admitting: Emergency Medicine

## 2014-06-08 DIAGNOSIS — D649 Anemia, unspecified: Secondary | ICD-10-CM | POA: Insufficient documentation

## 2014-06-08 DIAGNOSIS — K8050 Calculus of bile duct without cholangitis or cholecystitis without obstruction: Secondary | ICD-10-CM | POA: Diagnosis present

## 2014-06-08 DIAGNOSIS — K760 Fatty (change of) liver, not elsewhere classified: Secondary | ICD-10-CM | POA: Diagnosis not present

## 2014-06-08 DIAGNOSIS — E668 Other obesity: Secondary | ICD-10-CM | POA: Diagnosis present

## 2014-06-08 DIAGNOSIS — Z6841 Body Mass Index (BMI) 40.0 and over, adult: Secondary | ICD-10-CM | POA: Insufficient documentation

## 2014-06-08 DIAGNOSIS — K8000 Calculus of gallbladder with acute cholecystitis without obstruction: Principal | ICD-10-CM | POA: Diagnosis present

## 2014-06-08 DIAGNOSIS — R1901 Right upper quadrant abdominal swelling, mass and lump: Secondary | ICD-10-CM

## 2014-06-08 DIAGNOSIS — F329 Major depressive disorder, single episode, unspecified: Secondary | ICD-10-CM | POA: Insufficient documentation

## 2014-06-08 DIAGNOSIS — F419 Anxiety disorder, unspecified: Secondary | ICD-10-CM | POA: Insufficient documentation

## 2014-06-08 DIAGNOSIS — E669 Obesity, unspecified: Secondary | ICD-10-CM | POA: Diagnosis present

## 2014-06-08 DIAGNOSIS — K8020 Calculus of gallbladder without cholecystitis without obstruction: Secondary | ICD-10-CM

## 2014-06-08 DIAGNOSIS — R1110 Vomiting, unspecified: Secondary | ICD-10-CM

## 2014-06-08 LAB — COMPREHENSIVE METABOLIC PANEL
ALT: 17 U/L (ref 0–35)
AST: 12 U/L (ref 0–37)
Albumin: 3.8 g/dL (ref 3.5–5.2)
Alkaline Phosphatase: 72 U/L (ref 39–117)
Anion gap: 7 (ref 5–15)
BUN: 15 mg/dL (ref 6–23)
CHLORIDE: 112 meq/L (ref 96–112)
CO2: 20 mmol/L (ref 19–32)
Calcium: 8.8 mg/dL (ref 8.4–10.5)
Creatinine, Ser: 0.8 mg/dL (ref 0.50–1.10)
GFR calc Af Amer: >90 mL/min (ref >90)
GFR calc non Af Amer: >90 mL/min (ref >90)
GLUCOSE: 118 mg/dL — AB (ref 70–99)
Potassium: 3.7 mmol/L (ref 3.5–5.1)
Sodium: 139 mmol/L (ref 135–145)
TOTAL PROTEIN: 7.4 g/dL (ref 6.0–8.3)
Total Bilirubin: 0.4 mg/dL (ref 0.3–1.2)

## 2014-06-08 LAB — URINALYSIS, ROUTINE W REFLEX MICROSCOPIC
Bilirubin Urine: NEGATIVE
GLUCOSE, UA: NEGATIVE mg/dL
Ketones, ur: 15 mg/dL — AB
NITRITE: NEGATIVE
PH: 5 (ref 5.0–8.0)
PROTEIN: 100 mg/dL — AB
Specific Gravity, Urine: 1.026 (ref 1.005–1.030)
UROBILINOGEN UA: 0.2 mg/dL (ref 0.0–1.0)

## 2014-06-08 LAB — CBC WITH DIFFERENTIAL/PLATELET
BASOS ABS: 0 10*3/uL (ref 0.0–0.1)
Basophils Relative: 0 % (ref 0–1)
EOS PCT: 2 % (ref 0–5)
Eosinophils Absolute: 0.2 10*3/uL (ref 0.0–0.7)
HEMATOCRIT: 35.1 % — AB (ref 36.0–46.0)
Hemoglobin: 10.7 g/dL — ABNORMAL LOW (ref 12.0–15.0)
Lymphocytes Relative: 18 % (ref 12–46)
Lymphs Abs: 1.8 10*3/uL (ref 0.7–4.0)
MCH: 21.8 pg — ABNORMAL LOW (ref 26.0–34.0)
MCHC: 30.5 g/dL (ref 30.0–36.0)
MCV: 71.5 fL — AB (ref 78.0–100.0)
MONO ABS: 0.5 10*3/uL (ref 0.1–1.0)
Monocytes Relative: 5 % (ref 3–12)
NEUTROS PCT: 75 % (ref 43–77)
Neutro Abs: 7.4 10*3/uL (ref 1.7–7.7)
Platelets: 251 10*3/uL (ref 150–400)
RBC: 4.91 MIL/uL (ref 3.87–5.11)
RDW: 18 % — AB (ref 11.5–15.5)
WBC: 9.9 10*3/uL (ref 4.0–10.5)

## 2014-06-08 LAB — URINE MICROSCOPIC-ADD ON

## 2014-06-08 LAB — LIPASE, BLOOD: Lipase: 22 U/L (ref 11–59)

## 2014-06-08 LAB — PREGNANCY, URINE: Preg Test, Ur: NEGATIVE

## 2014-06-08 MED ORDER — MORPHINE SULFATE 4 MG/ML IJ SOLN
4.0000 mg | Freq: Once | INTRAMUSCULAR | Status: AC
  Administered 2014-06-08: 4 mg via INTRAVENOUS
  Filled 2014-06-08: qty 1

## 2014-06-08 MED ORDER — HYDROMORPHONE HCL 1 MG/ML IJ SOLN
1.0000 mg | Freq: Once | INTRAMUSCULAR | Status: AC
  Administered 2014-06-08: 1 mg via INTRAVENOUS
  Filled 2014-06-08: qty 1

## 2014-06-08 MED ORDER — PROMETHAZINE HCL 25 MG/ML IJ SOLN
25.0000 mg | Freq: Once | INTRAMUSCULAR | Status: AC
  Administered 2014-06-08: 25 mg via INTRAVENOUS
  Filled 2014-06-08: qty 1

## 2014-06-08 MED ORDER — HEPARIN SODIUM (PORCINE) 5000 UNIT/ML IJ SOLN: 5000.0000 [IU] | Freq: Three times a day (TID) | INTRAMUSCULAR | Status: DC

## 2014-06-08 MED ORDER — ONDANSETRON HCL 4 MG/2ML IJ SOLN
4.0000 mg | Freq: Once | INTRAMUSCULAR | Status: AC
  Administered 2014-06-08: 4 mg via INTRAVENOUS
  Filled 2014-06-08: qty 2

## 2014-06-08 MED ORDER — KCL IN DEXTROSE-NACL 20-5-0.45 MEQ/L-%-% IV SOLN
INTRAVENOUS | Status: DC
  Administered 2014-06-09 – 2014-06-10 (×3): via INTRAVENOUS
  Filled 2014-06-08 (×6): qty 1000

## 2014-06-08 MED ORDER — MORPHINE SULFATE 2 MG/ML IJ SOLN
1.0000 mg | INTRAMUSCULAR | Status: DC | PRN
  Administered 2014-06-08: 4 mg via INTRAVENOUS
  Administered 2014-06-09: 2 mg via INTRAVENOUS
  Administered 2014-06-09 – 2014-06-10 (×5): 4 mg via INTRAVENOUS
  Filled 2014-06-08: qty 1
  Filled 2014-06-08 (×7): qty 2

## 2014-06-08 MED ORDER — ACETAMINOPHEN 325 MG PO TABS
650.0000 mg | ORAL_TABLET | Freq: Four times a day (QID) | ORAL | Status: DC | PRN
  Administered 2014-06-09: 650 mg via ORAL
  Filled 2014-06-08: qty 2

## 2014-06-08 MED ORDER — CIPROFLOXACIN IN D5W 400 MG/200ML IV SOLN
400.0000 mg | Freq: Two times a day (BID) | INTRAVENOUS | Status: DC
  Administered 2014-06-08 – 2014-06-10 (×4): 400 mg via INTRAVENOUS
  Filled 2014-06-08 (×5): qty 200

## 2014-06-08 MED ORDER — ACETAMINOPHEN 650 MG RE SUPP: 650.0000 mg | Freq: Four times a day (QID) | RECTAL | Status: DC | PRN

## 2014-06-08 MED ORDER — ONDANSETRON HCL 4 MG/2ML IJ SOLN
4.0000 mg | Freq: Four times a day (QID) | INTRAMUSCULAR | Status: DC | PRN
  Administered 2014-06-08 – 2014-06-10 (×4): 4 mg via INTRAVENOUS
  Filled 2014-06-08 (×5): qty 2

## 2014-06-08 MED ORDER — SODIUM CHLORIDE 0.9 % IV BOLUS (SEPSIS)
1000.0000 mL | Freq: Once | INTRAVENOUS | Status: AC
  Administered 2014-06-08: 1000 mL via INTRAVENOUS

## 2014-06-08 MED ORDER — DIPHENHYDRAMINE HCL 50 MG/ML IJ SOLN: 12.5000 mg | Freq: Four times a day (QID) | INTRAMUSCULAR | Status: DC | PRN

## 2014-06-08 MED ORDER — SODIUM CHLORIDE 0.9 % IV SOLN
Freq: Once | INTRAVENOUS | Status: AC
  Administered 2014-06-08: 16:00:00 via INTRAVENOUS

## 2014-06-08 MED ORDER — HYDROMORPHONE HCL 1 MG/ML IJ SOLN
1.0000 mg | Freq: Once | INTRAMUSCULAR | Status: AC
Start: 2014-06-08 — End: 2014-06-08
  Administered 2014-06-08: 1 mg via INTRAVENOUS
  Filled 2014-06-08: qty 1

## 2014-06-08 MED ORDER — ONDANSETRON HCL 4 MG/2ML IJ SOLN: 4.0000 mg | Freq: Once | INTRAMUSCULAR | Status: DC

## 2014-06-08 MED ORDER — ALBUTEROL SULFATE (2.5 MG/3ML) 0.083% IN NEBU: 3.0000 mL | INHALATION_SOLUTION | Freq: Four times a day (QID) | RESPIRATORY_TRACT | Status: DC | PRN

## 2014-06-08 MED ORDER — DIPHENHYDRAMINE HCL 12.5 MG/5ML PO ELIX
12.5000 mg | ORAL_SOLUTION | Freq: Four times a day (QID) | ORAL | Status: DC | PRN
  Filled 2014-06-08: qty 10

## 2014-06-08 NOTE — ED Notes (Signed)
Severe right side abd pain woke her up at 3am with vomiting and diarrhea with severe pain. Pt states has had heavy periods [facility] and stopped bleesding 8 days ago and today began heavy bleeding again.

## 2014-06-08 NOTE — ED Notes (Signed)
Dr Wilson at bedside.

## 2014-06-08 NOTE — ED Notes (Signed)
PA at bedside.

## 2014-06-08 NOTE — ED Provider Notes (Signed)
CSN: [id number]     Arrival date & time 06/08/14  0848 History   First MD Initiated Contact with Patient 06/08/14 [id number]     Chief Complaint  Patient presents with  . Abdominal Pain   Sheila Donovan is a 24 y.o. female who presents to emergency department complaining of right upper quadrant abdominal pain since 3 AM this morning associated with nausea, vomiting and diarrhea. The patient reports she's vomited 3 times and diarrhea about 10 times today. Patient reports having right upper quadrant pain that she rates at an 8 out of 10 and is worse with movement. Patient reports she's been having vaginal bleeding from her menses for the past 4 months and was placed on birth control on 05/29/2014. Patient reports she noted vaginal bleeding again today. The patient has not soiled any pads today. The patient has attempted no treatment today. The patient does report chills without fevers. Patient has previous abdominal surgeries. The patient denies fevers, vaginal discharge, dysuria, urinary frequency, urinary urgency, hematemesis, hematochezia.   (Consider location/radiation/quality/duration/timing/severity/associated sxs/prior Treatment) HPI  Past Medical History  Diagnosis Date  . Chronic bronchitis   . Anemia   . Depression   . Anxiety   . History of ear infections    Past Surgical History  Procedure Laterality Date  . Tonsillectomy    . Adenoidectomy    . Tympanostomy tube placement     Family History  Problem Relation Age of Onset  . Diabetes Mother   . Hyperlipidemia Mother   . Hypertension Mother   . Heart attack Neg Hx   . Sudden death Neg Hx    History  Substance Use Topics  . Smoking status: Never Smoker   . Smokeless tobacco: Not on file  . Alcohol Use: Yes     Comment: occ   OB History    No data available     Review of Systems  Constitutional: Positive for chills. Negative for fever.  HENT: Negative for congestion, sore throat and trouble swallowing.   Eyes:  Negative for visual disturbance.  Respiratory: Negative for cough, shortness of breath and wheezing.   Cardiovascular: Negative for chest pain and palpitations.  Gastrointestinal: Positive for nausea, vomiting, abdominal pain and diarrhea. Negative for blood in stool.  Genitourinary: Positive for vaginal bleeding. Negative for dysuria, urgency, flank pain, decreased urine volume, vaginal discharge, difficulty urinating, genital sores and vaginal pain.  Musculoskeletal: Negative for back pain and neck pain.  Skin: Negative for rash.  Neurological: Negative for light-headedness and headaches.  All other systems reviewed and are negative.     Allergies  Penicillins  Home Medications   Prior to Admission medications   Medication Sig Start Date End Date Taking? Authorizing Provider  albuterol (PROVENTIL HFA;VENTOLIN HFA) 108 (90 BASE) MCG/ACT inhaler Inhale 2 puffs into the lungs every 6 (six) hours as needed for wheezing.    Historical Provider, MD  benzonatate (TESSALON PERLES) 100 MG capsule Take 2 capsules (200 mg total) by mouth 2 (two) times daily. 08/19/13   Renee A Kuneff, DO  guaiFENesin-codeine 100-10 MG/5ML syrup Take 10 mLs by mouth every 6 (six) hours as needed for cough. 08/05/13   Geoffery Lyons, MD  HYDROcodone-acetaminophen (NORCO/VICODIN) 5-325 MG per tablet Take 1-2 tablets by mouth every 6 (six) hours as needed for moderate pain. 01/21/14   Vanetta Mulders, MD  medroxyPROGESTERone (PROVERA) 10 MG tablet Take 1 tablet (10 mg total) by mouth daily. 01/13/13   Lyanne Co, MD  meloxicam [facility]) 15  MG tablet Take 1 tablet (15 mg total) by mouth daily. 12/04/12   Lenda Kelp, MD  naproxen (NAPROSYN) 500 MG tablet Take 1 tablet (500 mg total) by mouth 2 (two) times daily. 01/21/14   Vanetta Mulders, MD  predniSONE (DELTASONE) 50 MG tablet Take 1 tablet (50 mg total) by mouth daily with breakfast. 08/19/13   Renee A Kuneff, DO  traMADol (ULTRAM) 50 MG tablet Take 1 tablet (50 mg  total) by mouth every 8 (eight) hours as needed for pain. 01/06/13   Lenda Kelp, MD   BP 144/77 mmHg  Pulse 74  Temp(Src) 98.1 F (36.7 C) (Oral)  Resp 18  Ht  (1.575 m)  Wt 339 lb (153.769 kg)  BMI 61.99 kg/m2  SpO2 99%  LMP 05/31/2014 Physical Exam  Constitutional: She appears well-developed and well-nourished. No distress.  Obese female   HENT:  Head: Normocephalic and atraumatic.  Mouth/Throat: Oropharynx is clear and moist.  Eyes: Conjunctivae are normal. Pupils are equal, round, and reactive to light. Right eye exhibits no discharge. Left eye exhibits no discharge.  Neck: Neck supple.  Cardiovascular: Normal rate, regular rhythm, normal heart sounds and intact distal pulses.  Exam reveals no gallop and no friction rub.   No murmur heard. Pulmonary/Chest: Effort normal and breath sounds normal. No respiratory distress. She has no wheezes. She has no rales.  Abdominal: Soft. Bowel sounds are normal. She exhibits no distension and no mass. There is tenderness. There is no rebound and no guarding.  Patient's abdomen is soft. Bowel sounds are present. Patient has right upper quadrant tenderness to palpation. No right lower quadrant tenderness. Negative Rovsing sign. Negative psoas and obturator sign.   Musculoskeletal: She exhibits no edema.  Lymphadenopathy:    She has no cervical adenopathy.  Neurological: She is alert. Coordination normal.  Skin: Skin is warm and dry. No rash noted. She is not diaphoretic. No erythema. No pallor.  Psychiatric: She has a normal mood and affect. Her behavior is normal.  Nursing note and vitals reviewed.   ED Course  Procedures (including critical care time) Labs Review Labs Reviewed  URINALYSIS, ROUTINE W REFLEX MICROSCOPIC - Abnormal; Notable for the following:    Color, Urine RED (*)    APPearance CLOUDY (*)    Hgb urine dipstick LARGE (*)    Ketones, ur 15 (*)    Protein, ur 100 (*)    Leukocytes, UA TRACE (*)    All other  components within normal limits  COMPREHENSIVE METABOLIC PANEL - Abnormal; Notable for the following:    Glucose, Bld 118 (*)    All other components within normal limits  CBC WITH DIFFERENTIAL - Abnormal; Notable for the following:    Hemoglobin 10.7 (*)    HCT 35.1 (*)    MCV 71.5 (*)    MCH 21.8 (*)    RDW 18.0 (*)    All other components within normal limits  URINE MICROSCOPIC-ADD ON - Abnormal; Notable for the following:    Squamous Epithelial / LPF FEW (*)    Bacteria, UA MANY (*)    All other components within normal limits  PREGNANCY, URINE  LIPASE, BLOOD    Imaging Review US Abdomen Complete  06/08/2014   CLINICAL DATA:  Acute right upper quadrant abdominal pain.  EXAM: ULTRASOUND ABDOMEN COMPLETE  COMPARISON:  None.  FINDINGS: Gallbladder: 1.5 cm gallstone is noted in neck of gallbladder. No significant gallbladder wall thickening or pericholecystic fluid is noted. No sonographic  Murphy's sign is noted.  Common bile duct: Diameter: 8 mm which is mildly dilated. Correlation with liver function tests is recommended to rule out obstruction.  Liver: Coarse echotexture is noted suggesting diffuse hepatocellular disease. No focal abnormality is noted.  IVC: No abnormality visualized.  Pancreas: Visualized portion unremarkable.  Spleen: Spleen has maximum measured diameter of 16.3 cm with calculated volume of 586 cubic cm. This is consistent with mild splenomegaly.  Right Kidney: Length: 11.0 cm. Echogenicity within normal limits. No mass or hydronephrosis visualized.  Left Kidney: Length: 10.7 cm. Echogenicity within normal limits. No mass or hydronephrosis visualized.  Abdominal aorta: No aneurysm visualized.  Other findings: None.  IMPRESSION: 1.5 cm gallstones seen in neck of gallbladder without gallbladder wall thickening or pericholecystic fluid.  Common bile duct measures 8 mm distally which is mildly dilated. Correlation with liver function tests is recommended to evaluate for  possible obstruction.  Coarse echotexture of hepatic parenchyma is noted suggesting diffuse hepatocellular disease. Mild splenomegaly is noted as well.   Electronically Signed   By: Roque LiasJames  Green M.D.   On: 06/08/2014 10:33     EKG Interpretation None      Filed Vitals:   06/08/14 0855 06/08/14 1132 06/08/14 1336  BP: 142/76 135/64 144/77  Pulse: 87 67 74  Temp: 98.1 F (36.7 C)    TempSrc: Oral    Resp: 18 20 18   Height: 5\' 2"  (1.575 m)    Weight: 339 lb (153.769 kg)    SpO2: 100% 100% 99%     MDM   Meds given in ED:  Medications  sodium chloride 0.9 % bolus 1,000 mL (0 mLs Intravenous Stopped 06/08/14 1126)  ondansetron (ZOFRAN) injection 4 mg (4 mg Intravenous Given 06/08/14 0943)  morphine 4 MG/ML injection 4 mg (4 mg Intravenous Given 06/08/14 0943)  HYDROmorphone (DILAUDID) injection 1 mg (1 mg Intravenous Given 06/08/14 1153)  ondansetron (ZOFRAN) injection 4 mg (4 mg Intravenous Given 06/08/14 1151)  HYDROmorphone (DILAUDID) injection 1 mg (1 mg Intravenous Given 06/08/14 1357)  promethazine (PHENERGAN) injection 25 mg (25 mg Intravenous Given 06/08/14 1352)  sodium chloride 0.9 % bolus 1,000 mL (1,000 mLs Intravenous New Bag/Given 06/08/14 1352)    New Prescriptions   No medications on file    Final diagnoses:  Right upper quadrant abdominal mass  Gallstone  Intractable vomiting with nausea, vomiting of unspecified type   This is a 24 -year-old female with no previous abdominal surgeries who presented to the emergency department complaining of nausea, vomiting, diarrhea and right upper quadrant abdominal pain since 3 AM this morning. Abdominal ultrasound indicates a 1.5 cm gallstone in the neck of the gallbladder without gallbladder wall thickening or pericholecystic fluid. Patient's CMP is unremarkable. Patient has normal liver enzymes. Patient's lipase is 22. Patient's CBC is unremarkable. She has a negative urine pregnancy test. We have tried for several hours to  control her pain with zofran, phenergan, morphine and dilaudid without success. She has continued to vomit in the ED with each PO trial. Consult preformed by Dr. Anitra LauthPlunkett to general surgery for surgery today. Patient is accepted by Dr. Dwain SarnaWakefield [facility]. General surgeon is to see tonight by Dr. Andrey CampanileWilson.  Patient is in agreement with plan for surgery today.      Lawana ChambersWilliam Duncan Hanalei Glace, PA-C 06/08/14 1610  Gwyneth SproutWhitney Plunkett, MD 06/09/14 [phone]

## 2014-06-08 NOTE — ED Notes (Signed)
Pt given crackers and sprite.

## 2014-06-08 NOTE — H&P (Signed)
Sheila Donovan is an 24 y.o. female.   Chief Complaint: abd pain HPI: 24 year old morbidly obese Caucasian female awoke in the middle the night with diarrhea and abdominal pain. It was mainly on her right side in her upper abdomen. She was able to return back to sleep and went to work this morning however she had to leave for around 8:30 due to constant nausea and vomiting. She still had ongoing abdominal discomfort. She states that it hurt to stand straight up. She states that the pain made her double over. In hindsight she states that she might of had some several episodes in the past but not near as severe. She denies any melena, hematochezia, acholic stools or frequent NSAID use. She denies any weight loss. She went to [facility] and was evaluated with an abdominal ultrasound and lab work. She was sent to this hospital for management. She denies any tobacco use. She denies any prior abdominal surgery.  Past Medical History  Diagnosis Date  . Chronic bronchitis   . Anemia   . Depression   . Anxiety   . History of ear infections     Past Surgical History  Procedure Laterality Date  . Tonsillectomy    . Adenoidectomy    . Tympanostomy tube placement      Family History  Problem Relation Age of Onset  . Diabetes Mother   . Hyperlipidemia Mother   . Hypertension Mother   . Heart attack Neg Hx   . Sudden death Neg Hx    Social History:  reports that she has never smoked. She does not have any smokeless tobacco history on file. She reports that she drinks alcohol. She reports that she does not use illicit drugs.  Allergies:  Allergies  Allergen Reactions  . Penicillins Hives     (Not in a hospital admission)  Results for orders placed or performed during the hospital encounter of 06/08/14 (from the past 48 hour(s))  Pregnancy, urine     Status: None   Collection Time: 06/08/14  9:05 AM  Result Value Ref Range   Preg Test, Ur NEGATIVE NEGATIVE    Comment:        THE  SENSITIVITY OF THIS METHODOLOGY IS >20 mIU/mL.   Urinalysis, Routine w reflex microscopic     Status: Abnormal   Collection Time: 06/08/14  9:05 AM  Result Value Ref Range   Color, Urine RED (A) YELLOW    Comment: BIOCHEMICALS MAY BE AFFECTED BY COLOR   APPearance CLOUDY (A) CLEAR   Specific Gravity, Urine 1.026 1.005 - 1.030   pH 5.0 5.0 - 8.0   Glucose, UA NEGATIVE NEGATIVE mg/dL   Hgb urine dipstick LARGE (A) NEGATIVE   Bilirubin Urine NEGATIVE NEGATIVE   Ketones, ur 15 (A) NEGATIVE mg/dL   Protein, ur 100 (A) NEGATIVE mg/dL   Urobilinogen, UA 0.2 0.0 - 1.0 mg/dL   Nitrite NEGATIVE NEGATIVE   Leukocytes, UA TRACE (A) NEGATIVE  Urine microscopic-add on     Status: Abnormal   Collection Time: 06/08/14  9:05 AM  Result Value Ref Range   Squamous Epithelial / LPF FEW (A) RARE   WBC, UA 0-2 <3 WBC/hpf   RBC / HPF TOO NUMEROUS TO COUNT <3 RBC/hpf   Bacteria, UA MANY (A) RARE  Comprehensive metabolic panel     Status: Abnormal   Collection Time: 06/08/14  9:30 AM  Result Value Ref Range   Sodium 139 135 - 145 mmol/L  Comment: Please note change in reference range.   Potassium 3.7 3.5 - 5.1 mmol/L    Comment: Please note change in reference range.   Chloride 112 96 - 112 mEq/L   CO2 20 19 - 32 mmol/L   Glucose, Bld 118 (H) 70 - 99 mg/dL   BUN 15 6 - 23 mg/dL   Creatinine, Ser 0.80 0.50 - 1.10 mg/dL   Calcium 8.8 8.4 - 10.5 mg/dL   Total Protein 7.4 6.0 - 8.3 g/dL   Albumin 3.8 3.5 - 5.2 g/dL   AST 12 0 - 37 U/L   ALT 17 0 - 35 U/L   Alkaline Phosphatase 72 39 - 117 U/L   Total Bilirubin 0.4 0.3 - 1.2 mg/dL   GFR calc non Af Amer >90 >90 mL/min   GFR calc Af Amer >90 >90 mL/min    Comment: (NOTE) The eGFR has been calculated using the CKD EPI equation. This calculation has not been validated in all clinical situations. eGFR's persistently <90 mL/min signify possible Chronic Kidney Disease.    Anion gap 7 5 - 15  Lipase, blood     Status: None   Collection Time:  06/08/14  9:30 AM  Result Value Ref Range   Lipase 22 11 - 59 U/L  CBC with Differential     Status: Abnormal   Collection Time: 06/08/14  9:30 AM  Result Value Ref Range   WBC 9.9 4.0 - 10.5 K/uL   RBC 4.91 3.87 - 5.11 MIL/uL   Hemoglobin 10.7 (L) 12.0 - 15.0 g/dL   HCT 35.1 (L) 36.0 - 46.0 %   MCV 71.5 (L) 78.0 - 100.0 fL   MCH 21.8 (L) 26.0 - 34.0 pg   MCHC 30.5 30.0 - 36.0 g/dL   RDW 18.0 (H) 11.5 - 15.5 %   Platelets 251 150 - 400 K/uL   Neutrophils Relative % 75 43 - 77 %   Lymphocytes Relative 18 12 - 46 %   Monocytes Relative 5 3 - 12 %   Eosinophils Relative 2 0 - 5 %   Basophils Relative 0 0 - 1 %   Neutro Abs 7.4 1.7 - 7.7 K/uL   Lymphs Abs 1.8 0.7 - 4.0 K/uL   Monocytes Absolute 0.5 0.1 - 1.0 K/uL   Eosinophils Absolute 0.2 0.0 - 0.7 K/uL   Basophils Absolute 0.0 0.0 - 0.1 K/uL   WBC Morphology POLYCHROMASIA PRESENT    US Abdomen Complete  06/08/2014   CLINICAL DATA:  Acute right upper quadrant abdominal pain.  EXAM: ULTRASOUND ABDOMEN COMPLETE  COMPARISON:  None.  FINDINGS: Gallbladder: 1.5 cm gallstone is noted in neck of gallbladder. No significant gallbladder wall thickening or pericholecystic fluid is noted. No sonographic Murphy's sign is noted.  Common bile duct: Diameter: 8 mm which is mildly dilated. Correlation with liver function tests is recommended to rule out obstruction.  Liver: Coarse echotexture is noted suggesting diffuse hepatocellular disease. No focal abnormality is noted.  IVC: No abnormality visualized.  Pancreas: Visualized portion unremarkable.  Spleen: Spleen has maximum measured diameter of 16.3 cm with calculated volume of 586 cubic cm. This is consistent with mild splenomegaly.  Right Kidney: Length: 11.0 cm. Echogenicity within normal limits. No mass or hydronephrosis visualized.  Left Kidney: Length: 10.7 cm. Echogenicity within normal limits. No mass or hydronephrosis visualized.  Abdominal aorta: No aneurysm visualized.  Other findings: None.   IMPRESSION: 1.5 cm gallstones seen in neck of gallbladder without gallbladder wall thickening or pericholecystic  fluid.  Common bile duct measures 8 mm distally which is mildly dilated. Correlation with liver function tests is recommended to evaluate for possible obstruction.  Coarse echotexture of hepatic parenchyma is noted suggesting diffuse hepatocellular disease. Mild splenomegaly is noted as well.   Electronically Signed   By: Sabino Dick M.D.   On: 06/08/2014 10:33    Review of Systems  Constitutional: Negative for fever, chills and weight loss.  HENT: Negative for hearing loss.   Eyes: Negative for blurred vision and double vision.  Respiratory: Negative for cough, sputum production and shortness of breath.   Cardiovascular: Positive for leg swelling. Negative for chest pain, palpitations and orthopnea.  Gastrointestinal: Positive for nausea, vomiting, abdominal pain and diarrhea. Negative for constipation and blood in stool.  Genitourinary: Negative for dysuria and urgency.       Heavy periods recently for past 4 months; placed on OCP  Neurological: Negative for headaches.  Psychiatric/Behavioral: The patient is nervous/anxious.     Blood pressure 141/76, pulse 75, temperature 98.1 F (36.7 C), temperature source Oral, resp. rate 20, height 5' 2"  (1.575 m), weight 342 lb (155.13 kg), last menstrual period 05/31/2014, SpO2 100 %. Physical Exam  Vitals reviewed. Constitutional: She is oriented to person, place, and time. She appears well-developed and well-nourished. No distress.  Morbid obesity  HENT:  Head: Normocephalic and atraumatic.  Right Ear: External ear normal.  Left Ear: External ear normal.  Eyes: Conjunctivae are normal. No scleral icterus.  Neck: Normal range of motion. Neck supple. No tracheal deviation present. No thyromegaly present.  Cardiovascular: Normal rate and normal heart sounds.   Respiratory: Effort normal and breath sounds normal. No stridor. No  respiratory distress. She has no wheezes.  GI: Soft. She exhibits no distension. There is no rebound and no guarding.  Obese, mild RUQ ttp; no rt/guarding/peritonitis  Musculoskeletal: She exhibits no edema (no pitting edema) or tenderness.  Lymphadenopathy:    She has no cervical adenopathy.  Neurological: She is alert and oriented to person, place, and time. She exhibits normal muscle tone.  Skin: Skin is warm and dry. No rash noted. She is not diaphoretic. No erythema. No pallor.  Psychiatric: She has a normal mood and affect. Her behavior is normal. Judgment and thought content normal.     Assessment/Plan Acute calculous cholecystitis Anemia, probable chronic and iron deficient Morbid obesity Anxiety Hepatic steatosis  Admit hospital DVT prophylaxis IVF IV abx NPO p MN Plan cholecystectomy tomorrow Will repeat labs in am given nonspecific radiological finding of mild dilated CBD  Leighton Ruff. Redmond Pulling, MD, FACS General, Bariatric, & Minimally Invasive Surgery [facility], [place]   [facility] M 06/08/2014, 8:31 PM

## 2014-06-08 NOTE — ED Notes (Signed)
Patient transported to Ultrasound 

## 2014-06-08 NOTE — ED Provider Notes (Signed)
Plan to [facility].  Dr Dwain SarnaWakefield is the accepting physician in transfer to the [facility] service.  Dr Andrey CampanileWilson is to be paged after pt arrives to the Emergency Department.    Tilden FossaElizabeth Rossie Scarfone, MD 06/08/14 [phone]

## 2014-06-08 NOTE — ED Notes (Signed)
Report received and care assumed for lunch relief.

## 2014-06-09 ENCOUNTER — Observation Stay (HOSPITAL_COMMUNITY): Payer: Medicaid Other | Admitting: Anesthesiology

## 2014-06-09 ENCOUNTER — Encounter (HOSPITAL_COMMUNITY): Payer: Self-pay | Admitting: Anesthesiology

## 2014-06-09 ENCOUNTER — Encounter (HOSPITAL_COMMUNITY)
Admission: EM | Disposition: A | Discharge status: Home / Self Care | Payer: Self-pay | Source: Home / Self Care | Attending: Emergency Medicine

## 2014-06-09 DIAGNOSIS — K8000 Calculus of gallbladder with acute cholecystitis without obstruction: Secondary | ICD-10-CM | POA: Diagnosis not present

## 2014-06-09 DIAGNOSIS — D649 Anemia, unspecified: Secondary | ICD-10-CM | POA: Diagnosis not present

## 2014-06-09 DIAGNOSIS — Z6841 Body Mass Index (BMI) 40.0 and over, adult: Secondary | ICD-10-CM | POA: Diagnosis not present

## 2014-06-09 HISTORY — PX: CHOLECYSTECTOMY: SHX55

## 2014-06-09 LAB — COMPREHENSIVE METABOLIC PANEL
ALK PHOS: 78 U/L (ref 39–117)
ALT: 22 U/L (ref 0–35)
ANION GAP: 2 — AB (ref 5–15)
AST: 17 U/L (ref 0–37)
Albumin: 3.3 g/dL — ABNORMAL LOW (ref 3.5–5.2)
BILIRUBIN TOTAL: 0.5 mg/dL (ref 0.3–1.2)
BUN: 10 mg/dL (ref 6–23)
CHLORIDE: 111 meq/L (ref 96–112)
CO2: 22 mmol/L (ref 19–32)
Calcium: 7.5 mg/dL — ABNORMAL LOW (ref 8.4–10.5)
Creatinine, Ser: 0.85 mg/dL (ref 0.50–1.10)
Glucose, Bld: 97 mg/dL (ref 70–99)
POTASSIUM: 3.4 mmol/L — AB (ref 3.5–5.1)
SODIUM: 135 mmol/L (ref 135–145)
TOTAL PROTEIN: 6.8 g/dL (ref 6.0–8.3)

## 2014-06-09 LAB — CBC
HCT: 33.6 % — ABNORMAL LOW (ref 36.0–46.0)
Hemoglobin: 10.2 g/dL — ABNORMAL LOW (ref 12.0–15.0)
MCH: 22.1 pg — ABNORMAL LOW (ref 26.0–34.0)
MCHC: 30.4 g/dL (ref 30.0–36.0)
MCV: 72.9 fL — ABNORMAL LOW (ref 78.0–100.0)
PLATELETS: 222 10*3/uL (ref 150–400)
RBC: 4.61 MIL/uL (ref 3.87–5.11)
RDW: 17.4 % — ABNORMAL HIGH (ref 11.5–15.5)
WBC: 6.6 10*3/uL (ref 4.0–10.5)

## 2014-06-09 LAB — SURGICAL PCR SCREEN
MRSA, PCR: NEGATIVE
Staphylococcus aureus: NEGATIVE

## 2014-06-09 SURGERY — LAPAROSCOPIC CHOLECYSTECTOMY WITH INTRAOPERATIVE CHOLANGIOGRAM
Anesthesia: General | Site: Abdomen

## 2014-06-09 MED ORDER — LACTATED RINGERS IV SOLN
INTRAVENOUS | Status: DC
  Administered 2014-06-09 (×2): via INTRAVENOUS

## 2014-06-09 MED ORDER — BUPIVACAINE-EPINEPHRINE 0.25% -1:200000 IJ SOLN
INTRAMUSCULAR | Status: DC | PRN
  Administered 2014-06-09: 30 mL

## 2014-06-09 MED ORDER — BUTALBITAL-APAP-CAFFEINE 50-325-40 MG PO TABS: 1.0000 | ORAL_TABLET | Freq: Two times a day (BID) | ORAL | Status: DC | PRN

## 2014-06-09 MED ORDER — SUCCINYLCHOLINE CHLORIDE 20 MG/ML IJ SOLN
INTRAMUSCULAR | Status: DC | PRN
  Administered 2014-06-09: 120 mg via INTRAVENOUS

## 2014-06-09 MED ORDER — GLYCOPYRROLATE 0.2 MG/ML IJ SOLN
INTRAMUSCULAR | Status: DC | PRN
  Administered 2014-06-09: .8 mg via INTRAVENOUS

## 2014-06-09 MED ORDER — HYDROMORPHONE HCL 1 MG/ML IJ SOLN
INTRAMUSCULAR | Status: AC
Start: 2014-06-09 — End: 2014-06-09
  Administered 2014-06-09: 0.25 mg via INTRAVENOUS
  Filled 2014-06-09: qty 1

## 2014-06-09 MED ORDER — MIDAZOLAM HCL 5 MG/5ML IJ SOLN
INTRAMUSCULAR | Status: DC | PRN
  Administered 2014-06-09: 2 mg via INTRAVENOUS

## 2014-06-09 MED ORDER — OXYCODONE HCL 5 MG PO TABS
5.0000 mg | ORAL_TABLET | Freq: Once | ORAL | Status: AC
  Administered 2014-06-09: 5 mg via ORAL

## 2014-06-09 MED ORDER — BUPIVACAINE-EPINEPHRINE (PF) 0.25% -1:200000 IJ SOLN
INTRAMUSCULAR | Status: AC
  Filled 2014-06-09: qty 30

## 2014-06-09 MED ORDER — HYDROMORPHONE HCL 1 MG/ML IJ SOLN
INTRAMUSCULAR | Status: AC
  Administered 2014-06-09: 0.5 mg via INTRAVENOUS
  Filled 2014-06-09: qty 1

## 2014-06-09 MED ORDER — GLYCOPYRROLATE 0.2 MG/ML IJ SOLN
INTRAMUSCULAR | Status: AC
  Filled 2014-06-09: qty 3

## 2014-06-09 MED ORDER — HYDROCODONE-ACETAMINOPHEN 5-325 MG PO TABS
1.0000 | ORAL_TABLET | ORAL | Status: DC | PRN
  Administered 2014-06-09 – 2014-06-10 (×3): 2 via ORAL
  Filled 2014-06-09 (×3): qty 2

## 2014-06-09 MED ORDER — SODIUM CHLORIDE 0.9 % IV SOLN
INTRAVENOUS | Status: DC | PRN
  Administered 2014-06-09: 50 mL

## 2014-06-09 MED ORDER — HYDROMORPHONE HCL 1 MG/ML IJ SOLN
0.2500 mg | INTRAMUSCULAR | Status: DC | PRN
  Administered 2014-06-09: 0.25 mg via INTRAVENOUS
  Administered 2014-06-09: 0.5 mg via INTRAVENOUS
  Administered 2014-06-09 (×2): 0.25 mg via INTRAVENOUS
  Administered 2014-06-09: 0.5 mg via INTRAVENOUS
  Administered 2014-06-09: 0.25 mg via INTRAVENOUS

## 2014-06-09 MED ORDER — LIDOCAINE HCL (CARDIAC) 20 MG/ML IV SOLN
INTRAVENOUS | Status: DC | PRN
  Administered 2014-06-09: 80 mg via INTRAVENOUS

## 2014-06-09 MED ORDER — ROCURONIUM BROMIDE 100 MG/10ML IV SOLN
INTRAVENOUS | Status: DC | PRN
  Administered 2014-06-09: 30 mg via INTRAVENOUS
  Administered 2014-06-09: 10 mg via INTRAVENOUS

## 2014-06-09 MED ORDER — ENOXAPARIN SODIUM 40 MG/0.4ML ~~LOC~~ SOLN
40.0000 mg | SUBCUTANEOUS | Status: DC
  Administered 2014-06-10: 40 mg via SUBCUTANEOUS
  Filled 2014-06-09: qty 0.4

## 2014-06-09 MED ORDER — 0.9 % SODIUM CHLORIDE (POUR BTL) OPTIME
TOPICAL | Status: DC | PRN
  Administered 2014-06-09: 1000 mL

## 2014-06-09 MED ORDER — PROMETHAZINE HCL 25 MG/ML IJ SOLN: 6.2500 mg | INTRAMUSCULAR | Status: DC | PRN

## 2014-06-09 MED ORDER — PROPOFOL 10 MG/ML IV BOLUS
INTRAVENOUS | Status: AC
  Filled 2014-06-09: qty 20

## 2014-06-09 MED ORDER — ROCURONIUM BROMIDE 50 MG/5ML IV SOLN
INTRAVENOUS | Status: AC
  Filled 2014-06-09: qty 1

## 2014-06-09 MED ORDER — FENTANYL CITRATE 0.05 MG/ML IJ SOLN
INTRAMUSCULAR | Status: DC | PRN
  Administered 2014-06-09: 50 ug via INTRAVENOUS
  Administered 2014-06-09: 75 ug via INTRAVENOUS
  Administered 2014-06-09: 50 ug via INTRAVENOUS
  Administered 2014-06-09: 75 ug via INTRAVENOUS

## 2014-06-09 MED ORDER — NEOSTIGMINE METHYLSULFATE 10 MG/10ML IV SOLN
INTRAVENOUS | Status: DC | PRN
  Administered 2014-06-09: 5 mg via INTRAVENOUS

## 2014-06-09 MED ORDER — LIDOCAINE HCL (CARDIAC) 20 MG/ML IV SOLN
INTRAVENOUS | Status: AC
  Filled 2014-06-09: qty 5

## 2014-06-09 MED ORDER — OXYCODONE HCL 5 MG PO TABS
ORAL_TABLET | ORAL | Status: AC
  Administered 2014-06-09: 5 mg via ORAL
  Filled 2014-06-09: qty 1

## 2014-06-09 MED ORDER — GLYCOPYRROLATE 0.2 MG/ML IJ SOLN
INTRAMUSCULAR | Status: AC
  Filled 2014-06-09: qty 1

## 2014-06-09 MED ORDER — PROPOFOL 10 MG/ML IV BOLUS
INTRAVENOUS | Status: DC | PRN
  Administered 2014-06-09: 200 mg via INTRAVENOUS

## 2014-06-09 MED ORDER — ONDANSETRON HCL 4 MG/2ML IJ SOLN
INTRAMUSCULAR | Status: AC
  Filled 2014-06-09: qty 2

## 2014-06-09 MED ORDER — ONDANSETRON HCL 4 MG/2ML IJ SOLN
INTRAMUSCULAR | Status: DC | PRN
  Administered 2014-06-09: 4 mg via INTRAVENOUS

## 2014-06-09 MED ORDER — MIDAZOLAM HCL 2 MG/2ML IJ SOLN
INTRAMUSCULAR | Status: AC
  Filled 2014-06-09: qty 2

## 2014-06-09 MED ORDER — SODIUM CHLORIDE 0.9 % IR SOLN
Status: DC | PRN
  Administered 2014-06-09: 1000 mL

## 2014-06-09 MED ORDER — NORGESTIMATE-ETH ESTRADIOL 0.25-35 MG-MCG PO TABS
1.0000 | ORAL_TABLET | Freq: Every day | ORAL | Status: DC
  Administered 2014-06-09: 1 via ORAL

## 2014-06-09 MED ORDER — TOPIRAMATE 25 MG PO TABS
75.0000 mg | ORAL_TABLET | Freq: Every day | ORAL | Status: DC
  Administered 2014-06-09: 75 mg via ORAL
  Filled 2014-06-09 (×2): qty 3

## 2014-06-09 MED ORDER — FENTANYL CITRATE 0.05 MG/ML IJ SOLN
INTRAMUSCULAR | Status: AC
  Filled 2014-06-09: qty 5

## 2014-06-09 SURGICAL SUPPLY — 52 items
APPLIER CLIP 5 13 M/L LIGAMAX5 (MISCELLANEOUS) ×2
BLADE SURG ROTATE 9660 (MISCELLANEOUS) ×2 IMPLANT
CANISTER SUCTION 2500CC (MISCELLANEOUS) ×2 IMPLANT
CHLORAPREP W/TINT 26ML (MISCELLANEOUS) ×2 IMPLANT
CLIP APPLIE 5 13 M/L LIGAMAX5 (MISCELLANEOUS) ×1 IMPLANT
COVER MAYO STAND STRL (DRAPES) ×2 IMPLANT
COVER SURGICAL LIGHT HANDLE (MISCELLANEOUS) ×2 IMPLANT
DEVICE TROCAR PUNCTURE CLOSURE (ENDOMECHANICALS) ×2 IMPLANT
DRAPE C-ARM 42X72 X-RAY (DRAPES) ×2 IMPLANT
DRAPE LAPAROSCOPIC ABDOMINAL (DRAPES) ×2 IMPLANT
DRAPE PROXIMA HALF (DRAPES) ×2 IMPLANT
ELECT REM PT RETURN 9FT ADLT (ELECTROSURGICAL) ×2
ELECTRODE REM PT RTRN 9FT ADLT (ELECTROSURGICAL) ×1 IMPLANT
GLOVE BIO SURGEON STRL SZ 6.5 (GLOVE) ×2 IMPLANT
GLOVE BIO SURGEON STRL SZ7 (GLOVE) ×4 IMPLANT
GLOVE BIO SURGEON STRL SZ8 (GLOVE) ×2 IMPLANT
GLOVE BIOGEL PI IND STRL 6.5 (GLOVE) ×2 IMPLANT
GLOVE BIOGEL PI IND STRL 7.0 (GLOVE) ×4 IMPLANT
GLOVE BIOGEL PI IND STRL 7.5 (GLOVE) ×1 IMPLANT
GLOVE BIOGEL PI IND STRL 8 (GLOVE) ×1 IMPLANT
GLOVE BIOGEL PI INDICATOR 6.5 (GLOVE) ×2
GLOVE BIOGEL PI INDICATOR 7.0 (GLOVE) ×4
GLOVE BIOGEL PI INDICATOR 7.5 (GLOVE) ×1
GLOVE BIOGEL PI INDICATOR 8 (GLOVE) ×1
GLOVE SURG SS PI 6.5 STRL IVOR (GLOVE) ×2 IMPLANT
GLOVE SURG SS PI 7.0 STRL IVOR (GLOVE) ×4 IMPLANT
GOWN STRL REUS W/ TWL LRG LVL3 (GOWN DISPOSABLE) ×3 IMPLANT
GOWN STRL REUS W/ TWL XL LVL3 (GOWN DISPOSABLE) ×1 IMPLANT
GOWN STRL REUS W/TWL LRG LVL3 (GOWN DISPOSABLE) ×3
GOWN STRL REUS W/TWL XL LVL3 (GOWN DISPOSABLE) ×1
HEMOSTAT SNOW SURGICEL 2X4 (HEMOSTASIS) ×2 IMPLANT
HOVERMATT SINGLE USE (MISCELLANEOUS) ×2 IMPLANT
KIT BASIN OR (CUSTOM PROCEDURE TRAY) ×2 IMPLANT
KIT ROOM TURNOVER OR (KITS) ×2 IMPLANT
LIQUID BAND (GAUZE/BANDAGES/DRESSINGS) ×2 IMPLANT
NS IRRIG 1000ML POUR BTL (IV SOLUTION) ×2 IMPLANT
PAD ARMBOARD 7.5X6 YLW CONV (MISCELLANEOUS) ×2 IMPLANT
POUCH RETRIEVAL ECOSAC 10 (ENDOMECHANICALS) ×1 IMPLANT
POUCH RETRIEVAL ECOSAC 10MM (ENDOMECHANICALS) ×1
SCISSORS LAP 5X35 DISP (ENDOMECHANICALS) ×2 IMPLANT
SET CHOLANGIOGRAPH 5 50 .035 (SET/KITS/TRAYS/PACK) ×2 IMPLANT
SET IRRIG TUBING LAPAROSCOPIC (IRRIGATION / IRRIGATOR) ×2 IMPLANT
SLEEVE ENDOPATH XCEL 5M (ENDOMECHANICALS) ×6 IMPLANT
SPECIMEN JAR SMALL (MISCELLANEOUS) ×2 IMPLANT
SUT MNCRL AB 4-0 PS2 18 (SUTURE) ×2 IMPLANT
SUT VICRYL 0 UR6 27IN ABS (SUTURE) ×2 IMPLANT
TOWEL OR 17X24 6PK STRL BLUE (TOWEL DISPOSABLE) ×2 IMPLANT
TOWEL OR 17X26 10 PK STRL BLUE (TOWEL DISPOSABLE) ×2 IMPLANT
TRAY LAPAROSCOPIC (CUSTOM PROCEDURE TRAY) ×2 IMPLANT
TROCAR XCEL BLUNT TIP 100MML (ENDOMECHANICALS) ×2 IMPLANT
TROCAR XCEL NON-BLD 5MMX100MML (ENDOMECHANICALS) ×2 IMPLANT
TUBING INSUFFLATION (TUBING) ×2 IMPLANT

## 2014-06-09 NOTE — Anesthesia Procedure Notes (Signed)
Procedure Name: Intubation Date/Time: 06/09/2014 12:15 PM Performed by: Sharlene DoryWALKER, Patricia Perales E Pre-anesthesia Checklist: Patient identified, Emergency Drugs available, Suction available, Patient being monitored and Timeout performed Patient Re-evaluated:Patient Re-evaluated prior to inductionOxygen Delivery Method: Circle system utilized Preoxygenation: Pre-oxygenation with 100% oxygen Intubation Type: IV induction Ventilation: Mask ventilation without difficulty Tube type: Oral Tube size: 7.0 mm Number of attempts: 1 Airway Equipment and Method: Video-laryngoscopy Placement Confirmation: ETT inserted through vocal cords under direct vision,  positive ETCO2 and breath sounds checked- equal and bilateral Secured at: 21 cm Tube secured with: Tape Dental Injury: Teeth and Oropharynx as per pre-operative assessment  Comments: Electively used Glidescope. Grade II view on glidescope screen. AOI. +ETCO2 & BBS=.

## 2014-06-09 NOTE — Anesthesia Preprocedure Evaluation (Addendum)
Anesthesia Evaluation  Patient identified by MRN, date of birth, ID band Patient awake    Reviewed: Allergy & Precautions, NPO status , Patient's Chart, lab work & pertinent test results  Airway Mallampati: III       Dental  (+) Teeth Intact   Pulmonary neg pulmonary ROS,  breath sounds clear to auscultation        Cardiovascular Rhythm:Regular Rate:Normal     Neuro/Psych Depression    GI/Hepatic   Endo/Other  Morbid obesity  Renal/GU      Musculoskeletal   Abdominal (+) + obese,   Peds  Hematology  (+) anemia ,   Anesthesia Other Findings   Reproductive/Obstetrics                            Anesthesia Physical Anesthesia Plan  ASA: II  Anesthesia Plan: General   Post-op Pain Management:    Induction: Intravenous  Airway Management Planned: Oral ETT and Video Laryngoscope Planned  Additional Equipment:   Intra-op Plan:   Post-operative Plan: Extubation in OR  Informed Consent: I have reviewed the patients History and Physical, chart, labs and discussed the procedure including the risks, benefits and alternatives for the proposed anesthesia with the patient or authorized representative who has indicated his/her understanding and acceptance.   Dental advisory given  Plan Discussed with: CRNA and Anesthesiologist  Anesthesia Plan Comments:        Anesthesia Quick Evaluation

## 2014-06-09 NOTE — Transfer of Care (Signed)
Immediate Anesthesia Transfer of Care Note  Patient: Sheila Donovan  Procedure(s) Performed: Procedure(s): LAPAROSCOPIC CHOLECYSTECTOMY  (N/A)  Patient Location: PACU  Anesthesia Type:General  Level of Consciousness: awake, alert  and oriented  Airway & Oxygen Therapy: Patient Spontanous Breathing and Patient connected to face mask oxygen  Post-op Assessment: Report given to PACU RN, Post -op Vital signs reviewed and stable and Patient moving all extremities X 4  Post vital signs: Reviewed and stable  Complications: No apparent anesthesia complications

## 2014-06-09 NOTE — Op Note (Signed)
Preoperative diagnosis: Acute cholecystitis Postoperative diagnosis: same as above Procedure: Laparoscopic cholecystectomy  Surgeon: Dr. Harden MoMatt Athol Bolds Asst: Dr Violeta GelinasBurke Thompson Anesthesia: Gen. Estimated blood loss: Minimal Complications: None Drains: None Specimens: Gallbladder and contents to pathology Sponge count correct at completion Description to recovery stable  Indications: This a [age] yof who presents with acute cholecystitis.  I discussed laparoscopic cholecystectomy along with risks and benefits.   Procedure: After informed consent was obtained she was taken to the operating room. She was already on antibiotics. Sequential compression devices were on her legs. She was placed under general anesthesia without complication. Her abdomen was prepped and draped in the standard sterile surgical fashion. A surgical timeout was then performed.  I infiltrated marcaine below her umbilicus. I made a vertical incision and entered into the peritoneum bluntly. A 0 vicryl pursestring was placed and a hasson trocar introduced.The abdomen was insufflated to 15 mm Hg pressure.. I then placed 3 further 5 mm trocars in the epigastrium and right upper quadrant under direct vision without complication. I then was able to grasp the gallbladder.The gallbladder was tense. Once I grasped it there was a rent in it. There was a large amount of white bile that came out from it.  I also had to place another 5 mm trocar in the midline to adequately see all this.  I was able to dissect the ductal structures with some difficulty due to the fact that the 1.5 cm stone was impacted in the neck.  I identified the critical view of safety. I clipped and divided the cystic artery. I did both branches separately.  I then clipped the duct distally. I divided this. These clips completely traversed the duct and the duct was viable. I then removed the gallbladder from the liver bed without difficulty.  This was then placed in a  bag. This was eventually removed from the umbilicus. Hemostasis was observed. I removed my hasson trocar and closed my pursestring suture. I did place an additional 0 vicryl suture with the endoclose device to completely obliterate the defect. After I closed this I then removed the trocars and desufflated the abdomen. I then closed the skin with 4-0 Monocryl and glue. Steri-Strips were placed over this. She tolerated this was extended and transferred to the recovery room in stable condition.

## 2014-06-10 ENCOUNTER — Encounter (HOSPITAL_COMMUNITY): Payer: Self-pay | Admitting: General Surgery

## 2014-06-10 DIAGNOSIS — E668 Other obesity: Secondary | ICD-10-CM | POA: Diagnosis present

## 2014-06-10 DIAGNOSIS — K8000 Calculus of gallbladder with acute cholecystitis without obstruction: Secondary | ICD-10-CM | POA: Diagnosis present

## 2014-06-10 MED ORDER — ENOXAPARIN SODIUM 80 MG/0.8ML ~~LOC~~ SOLN: 70.0000 mg | Freq: Every day | SUBCUTANEOUS | Status: DC

## 2014-06-10 MED ORDER — ENOXAPARIN SODIUM 30 MG/0.3ML ~~LOC~~ SOLN
30.0000 mg | SUBCUTANEOUS | Status: DC
  Filled 2014-06-10: qty 0.3

## 2014-06-10 MED ORDER — HYDROCODONE-ACETAMINOPHEN 5-325 MG PO TABS: 1.0000 | ORAL_TABLET | Freq: Four times a day (QID) | ORAL | Status: DC | PRN

## 2014-06-10 NOTE — Discharge Instructions (Signed)

## 2014-06-10 NOTE — Progress Notes (Signed)
Nutrition Brief Note  Patient identified on the Malnutrition Screening Tool (MST) Report  Wt Readings from Last 15 Encounters:  06/08/14 342 lb (155.13 kg)  01/21/14 329 lb (149.233 kg)  08/19/13 320 lb (145.151 kg)  08/05/13 340 lb (154.223 kg)  01/13/13 300 lb (136.079 kg)  01/06/13 300 lb (136.079 kg)  12/04/12 290 lb (131.543 kg)  12/03/12 280 lb (127.30007 kg)   24 year old morbidly obese Caucasian female awoke in the middle the night with diarrhea and abdominal pain. It was mainly on her right side in her upper abdomen. She was able to return back to sleep and went to work this morning however she had to leave for around 8:30 due to constant nausea and vomiting. She still had ongoing abdominal discomfort. She states that it hurt to stand straight up. She states that the pain made her double over. In hindsight she states that she might of had some several episodes in the past but not near as severe. She denies any melena, hematochezia, acholic stools or frequent NSAID use. She denies any weight loss. [facility] and was evaluated with an abdominal ultrasound and lab work. She was sent to this hospital for management. She denies any tobacco use. She denies any prior abdominal surgery.  S/p lap chole on 06/09/13. She reports good appetite PTA up until day before admission due to abdominal pain and inability to keep foods down. She reports she tolerated her regular diet well, however, food "tastes funny". She reports "I think it's because my mouth was a desert for 3 days".   She has been intentionally losing weight by decreasing soft drink intake.   Body mass index is 61.75 kg/(m^2). Patient meets criteria for extreme obesity, class III based on current BMI.   Current diet order is regular, patient is consuming approximately n/a% (reports good appetite) of meals at this time. Labs and medications reviewed.   No nutrition interventions warranted at this time. If nutrition  issues arise, please consult RD.   Annalisia Ingber A. Mayford KnifeWilliams, RD, LDN, CDE Pager: [phone] After hours Pager: [phone]

## 2014-06-10 NOTE — Discharge Summary (Signed)
[facility] Discharge Summary   Patient ID: Sheila FriskBrittney Donovan MRN: [id number] DOB/AGE: Nov 27, 1990 23 y.o.  Admit date: 06/08/2014 Discharge date: 06/10/2014  Admitting Diagnosis: Acute calculous cholecystitis Biliary colic Extreme obesity - BMI 61    Discharge Diagnosis Patient Active Problem List   Diagnosis Date Noted  . Acute calculous cholecystitis 06/10/2014  . Extreme obesity 06/10/2014  . Biliary colic 06/08/2014  . Right foot pain 12/04/2012  . Chronic bronchitis   . Anemia   . Depression     Consultants None  Imaging: No results found.  Procedures Dr. Dwain SarnaWakefield (06/10/14) - [facility]  Hospital Course:  24 year old morbidly obese Caucasian female awoke in the middle the night with diarrhea and abdominal pain. It was mainly on her right side in her upper abdomen. She was able to return back to sleep and went to work this morning however she had to leave for around 8:30 due to constant nausea and vomiting. She still had ongoing abdominal discomfort. She states that it hurt to stand straight up. She states that the pain made her double over. In hindsight she states that she might of had some several episodes in the past but not near as severe. She denies any melena, hematochezia, acholic stools or frequent NSAID use. She denies any weight loss. [facility] and was evaluated with an abdominal ultrasound and lab work. She was sent to this hospital for management. She denies any tobacco use. She denies any prior abdominal surgery.  Workup showed acute calculous cholecystitis.  Patient was admitted and underwent procedure listed above.  Tolerated procedure well and was transferred to the floor.  Diet was advanced as tolerated.  On POD #1, the patient was voiding well, tolerating diet, ambulating well, pain well controlled, vital signs stable, incisions c/d/i and felt stable for discharge home.  Patient will follow up  in our office in 3 weeks and knows to call with questions or concerns.      Medication List    TAKE these medications        albuterol 108 (90 BASE) MCG/ACT inhaler  Commonly known as:  PROVENTIL HFA;VENTOLIN HFA  Inhale 2 puffs into the lungs every 6 (six) hours as needed for wheezing.     FIORICET 50-325-40 MG per tablet  Generic drug:  butalbital-acetaminophen-caffeine  Take 1 tablet by mouth 2 (two) times daily as needed for headache.     HYDROcodone-acetaminophen 5-325 MG per tablet  Commonly known as:  NORCO/VICODIN  Take 1-2 tablets by mouth every 6 (six) hours as needed for moderate pain or severe pain.     norgestimate-ethinyl estradiol 0.25-35 MG-MCG tablet  Commonly known as:  ORTHO-CYCLEN,SPRINTEC,PREVIFEM  Take 1 tablet by mouth daily.     topiramate 25 MG tablet  Commonly known as:  TOPAMAX  Take 75 mg by mouth at bedtime.         Follow-up Information    [facility]2014.   Why:  For post-operation check. Your appointment is at 2:45p, please arrive at least 30 min before your appointment to complete your check in paperwork.  If you are unable to arrive 30 min prior to your appointment time we may have to cancel or reschedule you.   Contact information:   [address]   [place] [address] [phone]       Signed: Aris GeorgiaMegan Dort, [facility] [phone]  06/10/2014, 3:24 PM

## 2014-06-10 NOTE — Progress Notes (Signed)
1 Day Post-Op  Subjective: Feels ok  Objective: Vital signs in last 24 hours: Temp:  [97.8 F (36.6 C)-98.7 F (37.1 C)] 97.8 F (36.6 C) (01/13 0610) Pulse Rate:  [57-79] 79 (01/13 0610) Resp:  [14-25] 14 (01/13 0610) BP: (109-156)/(73-82) 156/74 mmHg (01/13 0610) SpO2:  [91 %-96 %] 91 % (01/13 0610) Last BM Date: 06/08/14  Intake/Output from previous day: 01/12 0701 - 01/13 0700 In: 4818.7 [P.O.:822; I.V.:3796.7; IV Piggyback:200] Out: 1125 [Urine:1100; Blood:25] Intake/Output this shift: Total I/O In: -  Out: 600 [Urine:600]  GI: soft nt/nd incisions clean without infection  Lab Results:   Recent Labs  06/08/14 0930 06/09/14 0651  WBC 9.9 6.6  HGB 10.7* 10.2*  HCT 35.1* 33.6*  PLT 251 222   BMET  Recent Labs  06/08/14 0930 06/09/14 0651  NA 139 135  K 3.7 3.4*  CL 112 111  CO2 20 22  GLUCOSE 118* 97  BUN 15 10  CREATININE 0.80 0.85  CALCIUM 8.8 7.5*   Studies/Results: [place] Abdomen Complete  06/08/2014   CLINICAL DATA:  Acute right upper quadrant abdominal pain.  EXAM: ULTRASOUND ABDOMEN COMPLETE  COMPARISON:  None.  FINDINGS: Gallbladder: 1.5 cm gallstone is noted in neck of gallbladder. No significant gallbladder wall thickening or pericholecystic fluid is noted. No sonographic Murphy's sign is noted.  Common bile duct: Diameter: 8 mm which is mildly dilated. Correlation with liver function tests is recommended to rule out obstruction.  Liver: Coarse echotexture is noted suggesting diffuse hepatocellular disease. No focal abnormality is noted.  IVC: No abnormality visualized.  Pancreas: Visualized portion unremarkable.  Spleen: Spleen has maximum measured diameter of 16.3 cm with calculated volume of 586 cubic cm. This is consistent with mild splenomegaly.  Right Kidney: Length: 11.0 cm. Echogenicity within normal limits. No mass or hydronephrosis visualized.  Left Kidney: Length: 10.7 cm. Echogenicity within normal limits. No mass or hydronephrosis  visualized.  Abdominal aorta: No aneurysm visualized.  Other findings: None.  IMPRESSION: 1.5 cm gallstones seen in neck of gallbladder without gallbladder wall thickening or pericholecystic fluid.  Common bile duct measures 8 mm distally which is mildly dilated. Correlation with liver function tests is recommended to evaluate for possible obstruction.  Coarse echotexture of hepatic parenchyma is noted suggesting diffuse hepatocellular disease. Mild splenomegaly is noted as well.   Electronically Signed   By: Roque LiasJames  Green M.D.   On: 06/08/2014 10:33    Anti-infectives: Anti-infectives    Start     Dose/Rate Route Frequency Ordered Stop   06/08/14 2030  ciprofloxacin (CIPRO) IVPB 400 mg     400 mg200 mL/hr over 60 Minutes Intravenous Every 12 hours 06/08/14 2029        Assessment/Plan: POD 1 lap chole  Doing pretty well, will get up this am, if tolerates food and ambulates will dc home later today  [facility]KE[facility],Layani Foronda 06/10/2014

## 2014-06-10 NOTE — Progress Notes (Signed)
UR completed 

## 2014-06-15 NOTE — Anesthesia Postprocedure Evaluation (Signed)
  Anesthesia Post-op Note  Patient: Sheila Donovan  Procedure(s) Performed: Procedure(s): LAPAROSCOPIC CHOLECYSTECTOMY  (N/A)  Patient Location: PACU  Anesthesia Type:General  Level of Consciousness: awake and alert   Airway and Oxygen Therapy: Patient Spontanous Breathing  Post-op Pain: mild  Post-op Assessment: Post-op Vital signs reviewed  Post-op Vital Signs: stable  Last Vitals:  Filed Vitals:   06/10/14 1522  BP: 140/80  Pulse: 78  Temp: 37.1 C  Resp: 16    Complications: No apparent anesthesia complications

## 2015-03-15 ENCOUNTER — Encounter: Payer: Self-pay | Admitting: Family

## 2015-03-15 ENCOUNTER — Ambulatory Visit (INDEPENDENT_AMBULATORY_CARE_PROVIDER_SITE_OTHER): Payer: Commercial Managed Care - PPO | Admitting: Family

## 2015-03-15 VITALS — BP 126/76 | HR 78 | Temp 98.1°F | Resp 18 | Ht 60.0 in | Wt 343.0 lb

## 2015-03-15 DIAGNOSIS — L732 Hidradenitis suppurativa: Secondary | ICD-10-CM

## 2015-03-15 DIAGNOSIS — R200 Anesthesia of skin: Secondary | ICD-10-CM | POA: Diagnosis not present

## 2015-03-15 DIAGNOSIS — G43809 Other migraine, not intractable, without status migrainosus: Secondary | ICD-10-CM

## 2015-03-15 DIAGNOSIS — R739 Hyperglycemia, unspecified: Secondary | ICD-10-CM | POA: Diagnosis not present

## 2015-03-15 DIAGNOSIS — F3130 Bipolar disorder, current episode depressed, mild or moderate severity, unspecified: Secondary | ICD-10-CM

## 2015-03-15 DIAGNOSIS — J4520 Mild intermittent asthma, uncomplicated: Secondary | ICD-10-CM

## 2015-03-15 DIAGNOSIS — G43909 Migraine, unspecified, not intractable, without status migrainosus: Secondary | ICD-10-CM | POA: Insufficient documentation

## 2015-03-15 DIAGNOSIS — F319 Bipolar disorder, unspecified: Secondary | ICD-10-CM

## 2015-03-15 DIAGNOSIS — D649 Anemia, unspecified: Secondary | ICD-10-CM | POA: Diagnosis not present

## 2015-03-15 DIAGNOSIS — J45909 Unspecified asthma, uncomplicated: Secondary | ICD-10-CM | POA: Insufficient documentation

## 2015-03-15 LAB — CBC WITH DIFFERENTIAL/PLATELET
BASOS PCT: 0.4 % (ref 0.0–3.0)
Basophils Absolute: 0 10*3/uL (ref 0.0–0.1)
EOS ABS: 0.2 10*3/uL (ref 0.0–0.7)
EOS PCT: 2.4 % (ref 0.0–5.0)
HCT: 34.6 % — ABNORMAL LOW (ref 36.0–46.0)
Hemoglobin: 11 g/dL — ABNORMAL LOW (ref 12.0–15.0)
LYMPHS PCT: 28.6 % (ref 12.0–46.0)
Lymphs Abs: 2.4 10*3/uL (ref 0.7–4.0)
MCHC: 31.9 g/dL (ref 30.0–36.0)
MCV: 68.1 fl — ABNORMAL LOW (ref 78.0–100.0)
Monocytes Absolute: 0.5 10*3/uL (ref 0.1–1.0)
Monocytes Relative: 6.1 % (ref 3.0–12.0)
NEUTROS ABS: 5.2 10*3/uL (ref 1.4–7.7)
Neutrophils Relative %: 62.5 % (ref 43.0–77.0)
Platelets: 248 10*3/uL (ref 150.0–400.0)
RBC: 5.08 Mil/uL (ref 3.87–5.11)
RDW: 18.3 % — ABNORMAL HIGH (ref 11.5–15.5)
WBC: 8.3 10*3/uL (ref 4.0–10.5)

## 2015-03-15 LAB — HEMOGLOBIN A1C: HEMOGLOBIN A1C: 5.9 % (ref 4.6–6.5)

## 2015-03-15 LAB — BASIC METABOLIC PANEL
BUN: 13 mg/dL (ref 6–23)
CALCIUM: 9 mg/dL (ref 8.4–10.5)
CO2: 27 meq/L (ref 19–32)
Chloride: 106 mEq/L (ref 96–112)
Creatinine, Ser: 0.68 mg/dL (ref 0.40–1.20)
GFR: 110.73 mL/min (ref >60.00)
GLUCOSE: 97 mg/dL (ref 70–99)
Potassium: 4 mEq/L (ref 3.5–5.1)
Sodium: 141 mEq/L (ref 135–145)

## 2015-03-15 LAB — FOLATE: Folate: 8.8 ng/mL (ref >5.9)

## 2015-03-15 LAB — VITAMIN B12: Vitamin B-12: 251 pg/mL (ref 211–911)

## 2015-03-15 MED ORDER — SUMATRIPTAN SUCCINATE 50 MG PO TABS: 50.0000 mg | ORAL_TABLET | ORAL | Status: DC | PRN

## 2015-03-15 MED ORDER — DOXYCYCLINE HYCLATE 100 MG PO TABS: 100.0000 mg | ORAL_TABLET | Freq: Two times a day (BID) | ORAL | Status: DC

## 2015-03-15 MED ORDER — ALBUTEROL SULFATE HFA 108 (90 BASE) MCG/ACT IN AERS: 2.0000 | INHALATION_SPRAY | Freq: Four times a day (QID) | RESPIRATORY_TRACT | Status: AC | PRN

## 2015-03-15 MED ORDER — MELOXICAM 7.5 MG PO TABS: 7.5000 mg | ORAL_TABLET | Freq: Every day | ORAL | Status: DC

## 2015-03-15 MED ORDER — NORGESTIMATE-ETH ESTRADIOL 0.25-35 MG-MCG PO TABS: 1.0000 | ORAL_TABLET | Freq: Every day | ORAL | Status: DC

## 2015-03-15 MED ORDER — METOPROLOL TARTRATE 25 MG PO TABS: 25.0000 mg | ORAL_TABLET | Freq: Two times a day (BID) | ORAL | Status: DC

## 2015-03-15 NOTE — Progress Notes (Signed)
Pre visit review using our clinic review tool, if applicable. No additional management support is needed unless otherwise documented below in the visit note. 

## 2015-03-15 NOTE — Patient Instructions (Addendum)
Start metoprolol twice daily and imitrex as needed for migraines. Continue albuterol as needed for asthma. You will be contacted about your referral to the bariatric clinic as well as to the nutritionist.  Please contact psychiatry to arrange appointment for management of your bipolar depression.  Complete lab work prior to leaving. Start doxycyline for boils beneath your right arm. Apply warm compresses twice daily. Welcome to [facility]!  Psychiatric Services:  [facility], [address], [place], [place]   [phone] [facility], [address], [place], [place] [phone] [facility] [phone] [facility], [address] [place], [place] [phone] [facility], [address], [place], [place] [phone]

## 2015-03-15 NOTE — Assessment & Plan Note (Signed)
rx with doxy, warm compresses, follow up in 1 wee.

## 2015-03-15 NOTE — Assessment & Plan Note (Signed)
Uncontrolled. Add beta blocker, add prn imitrex

## 2015-03-15 NOTE — Assessment & Plan Note (Signed)
Will refer to psychiatry for ongoing management.

## 2015-03-15 NOTE — Assessment & Plan Note (Signed)
Obtain bmet 

## 2015-03-15 NOTE — Assessment & Plan Note (Signed)
Add albuterol prn.

## 2015-03-15 NOTE — Assessment & Plan Note (Signed)
BMI today is nearly 70.  Refer to nutritionist. She would also like bariatric referral- I have placed both referrrals.

## 2015-03-15 NOTE — Progress Notes (Signed)
Subjective:    Patient ID: Sheila Donovan, female    DOB: Mar 07, 1991, 24 y.o.   MRN: [id number]  HPI  Sheila Donovan is a 24 yr old female who presents today to establish care.  She has [facility].  She has several concerns:  1) Abscess- reports "2 sores" right axilla which are draining. Symptoms began 3 weeks ago. Feels that overall it is improving.    2) Numbness- bilateral plantar surface x 2 years.  She reports + hx of back pain. Stands 8-12 hours a day. Numbness is intermittent.  Notes that she rarely has radiation of low back pain.   3) Migraines-  She is maintained on topamax and fioricet. She reports that she was diagnosed in 2009.  She reports that she has been out of her medication for several months.  She reports that she has been having 2-3 migraines a week. Uses excedrin migraine which only works some of the past.  She has also tried imitrex in the past but reports that it was given through an IV.    3) Asthma- maintained on prn albuterol.   Reports that in 2010 she was placed on prn albuterol.  Notes some seasonal asthma   4)Bipolar  Depression- reports that she moved from [place] 3 yrs ago. Lives with her aunt and uncle.  Reports that she feels hopeless. Denies SI/HI.  In the past she has been treated with concerta, topamax and zoloft.  Reports that she has manic episodes (becomes OCD), has spending sprees.  Last manic episode that she remembers was in early September.   5) Anemia- has taken iron in the past but none recently.   Review of Systems  Constitutional:       Wt Readings from Last 3 Encounters: 03/15/15 : 343 lb (155.584 kg) 06/08/14 : 342 lb (155.13 kg) 01/21/14 : 329 lb (149.233 kg)  Reports max weight 360, down to 330, now back up Walks dog around the neighborhood. Eats lots of chicken/veggies  HENT: Positive for rhinorrhea.   Respiratory: Negative for cough.   Cardiovascular:       Notes some depended edema at the  end of the day  Gastrointestinal: Positive for diarrhea. Negative for constipation.       Some diarrhea last few days.   Genitourinary: Negative for dysuria and frequency.  Musculoskeletal:       Some left hip pain for 2 months, reports that she bumped into corner of table at work.   Neurological: Positive for headaches.  Hematological: Negative for adenopathy.  Psychiatric/Behavioral:       See HPI    Past Medical History  Diagnosis Date  . Chronic bronchitis (HCC)   . Anemia   . Depression   . Anxiety   . History of ear infections   . History of chicken pox   . Arthritis   . Migraines     Social History   Social History  . Marital Status: Single    Spouse Name: N/A  . Number of Children: N/A  . Years of Education: N/A   Occupational History  . Not on file.   Social History Main Topics  . Smoking status: Never Smoker   . Smokeless tobacco: Not on file  . Alcohol Use: 0.0 oz/week    0 Standard drinks or equivalent per week     Comment: occ / holidays  . Drug Use: No  . Sexual Activity: Not on file   Other  Topics Concern  . Not on file   Social History Narrative    Past Surgical History  Procedure Laterality Date  . Tonsillectomy    . Adenoidectomy    . Tympanostomy tube placement    . Cholecystectomy N/A 06/09/2014    Procedure: LAPAROSCOPIC CHOLECYSTECTOMY ;  Surgeon: Emelia Loron, MD;  Location: [facility] OR;  Service: General;  Laterality: N/A;    Family History  Problem Relation Age of Onset  . Diabetes Mother   . Hyperlipidemia Mother   . Hypertension Mother   . Depression Mother   . Alcohol abuse Mother   . Drug abuse Mother   . Heart attack Neg Hx   . Sudden death Neg Hx   . Depression Father   . Alcohol abuse Father   . Drug abuse Father   . Cancer Maternal Aunt     prostate  . Heart disease Maternal Aunt   . Alcohol abuse Maternal Grandmother   . Hyperlipidemia Maternal Grandmother   . Hypertension Maternal Grandmother   .  Hyperlipidemia Paternal Grandmother   . Hyperlipidemia Paternal Grandfather     Allergies  Allergen Reactions  . Penicillins Hives    Current Outpatient Prescriptions on File Prior to Visit  Medication Sig Dispense Refill  . norgestimate-ethinyl estradiol (ORTHO-CYCLEN,SPRINTEC,PREVIFEM) 0.25-35 MG-MCG tablet Take 1 tablet by mouth daily.     No current facility-administered medications on file prior to visit.    BP 126/76 mmHg  Pulse 78  Temp(Src) 98.1 F (36.7 C) (Oral)  Resp 18  Ht 5' (1.524 m)  Wt 343 lb (155.584 kg)  BMI 66.99 kg/m2  SpO2 99%  LMP 02/06/2015        Objective:   Physical Exam  Constitutional: She is oriented to person, place, and time. She appears well-developed and well-nourished.  Morbidly obese white female in NAD  HENT:  Head: Atraumatic.  Mouth/Throat: No oropharyngeal exudate.  Eyes: No scleral icterus.  Neck: No thyromegaly present.  Cardiovascular: Normal rate, regular rhythm and normal heart sounds.   No murmur heard. Pulmonary/Chest: Effort normal and breath sounds normal. No respiratory distress. She has no wheezes.  Abdominal: Soft.  Musculoskeletal: She exhibits no edema.  Lymphadenopathy:    She has no cervical adenopathy.  Neurological: She is alert and oriented to person, place, and time.  Skin: Skin is warm.  Two small open areas right axilla near trunk- draining yellow fluid Small pea sized area of fluctuance noted on right upper/inner arm near axilla  Psychiatric: Her behavior is normal. Judgment and thought content normal.  Brief episode of tearfulness upon discussion of depression hx.           Assessment & Plan:  Numbness of feet- obtain b12/folate level.  If normal and symptoms persist, consider MRI of the lumbar spine.

## 2015-03-18 ENCOUNTER — Other Ambulatory Visit (INDEPENDENT_AMBULATORY_CARE_PROVIDER_SITE_OTHER): Payer: Commercial Managed Care - PPO

## 2015-03-18 ENCOUNTER — Other Ambulatory Visit: Payer: Self-pay | Admitting: Family

## 2015-03-18 ENCOUNTER — Telehealth: Payer: Self-pay | Admitting: Family

## 2015-03-18 DIAGNOSIS — E538 Deficiency of other specified B group vitamins: Secondary | ICD-10-CM

## 2015-03-18 DIAGNOSIS — D509 Iron deficiency anemia, unspecified: Secondary | ICD-10-CM | POA: Diagnosis not present

## 2015-03-18 DIAGNOSIS — E611 Iron deficiency: Secondary | ICD-10-CM | POA: Insufficient documentation

## 2015-03-18 LAB — IRON: Iron: 26 ug/dL — ABNORMAL LOW (ref 42–145)

## 2015-03-18 NOTE — Telephone Encounter (Signed)
See mychart.  

## 2015-03-21 ENCOUNTER — Emergency Department (HOSPITAL_BASED_OUTPATIENT_CLINIC_OR_DEPARTMENT_OTHER)
Admission: EM | Admit: 2015-03-21 | Discharge: 2015-03-21 | Disposition: A | Discharge status: Home / Self Care | Payer: Commercial Managed Care - PPO | Attending: Emergency Medicine | Admitting: Emergency Medicine

## 2015-03-21 ENCOUNTER — Encounter (HOSPITAL_BASED_OUTPATIENT_CLINIC_OR_DEPARTMENT_OTHER): Payer: Self-pay

## 2015-03-21 ENCOUNTER — Emergency Department (HOSPITAL_BASED_OUTPATIENT_CLINIC_OR_DEPARTMENT_OTHER): Payer: Commercial Managed Care - PPO

## 2015-03-21 DIAGNOSIS — Y9289 Other specified places as the place of occurrence of the external cause: Secondary | ICD-10-CM | POA: Insufficient documentation

## 2015-03-21 DIAGNOSIS — S8992XA Unspecified injury of left lower leg, initial encounter: Secondary | ICD-10-CM | POA: Insufficient documentation

## 2015-03-21 DIAGNOSIS — Z8659 Personal history of other mental and behavioral disorders: Secondary | ICD-10-CM | POA: Insufficient documentation

## 2015-03-21 DIAGNOSIS — G43909 Migraine, unspecified, not intractable, without status migrainosus: Secondary | ICD-10-CM | POA: Diagnosis not present

## 2015-03-21 DIAGNOSIS — M1990 Unspecified osteoarthritis, unspecified site: Secondary | ICD-10-CM | POA: Diagnosis not present

## 2015-03-21 DIAGNOSIS — Z8619 Personal history of other infectious and parasitic diseases: Secondary | ICD-10-CM | POA: Insufficient documentation

## 2015-03-21 DIAGNOSIS — Z862 Personal history of diseases of the blood and blood-forming organs and certain disorders involving the immune mechanism: Secondary | ICD-10-CM | POA: Diagnosis not present

## 2015-03-21 DIAGNOSIS — Z79899 Other long term (current) drug therapy: Secondary | ICD-10-CM | POA: Diagnosis not present

## 2015-03-21 DIAGNOSIS — Y998 Other external cause status: Secondary | ICD-10-CM | POA: Insufficient documentation

## 2015-03-21 DIAGNOSIS — Z880 Allergy status to penicillin: Secondary | ICD-10-CM | POA: Insufficient documentation

## 2015-03-21 DIAGNOSIS — W01198A Fall on same level from slipping, tripping and stumbling with subsequent striking against other object, initial encounter: Secondary | ICD-10-CM | POA: Insufficient documentation

## 2015-03-21 DIAGNOSIS — Z791 Long term (current) use of non-steroidal anti-inflammatories (NSAID): Secondary | ICD-10-CM | POA: Diagnosis not present

## 2015-03-21 DIAGNOSIS — Z8709 Personal history of other diseases of the respiratory system: Secondary | ICD-10-CM | POA: Insufficient documentation

## 2015-03-21 DIAGNOSIS — Y9389 Activity, other specified: Secondary | ICD-10-CM | POA: Insufficient documentation

## 2015-03-21 DIAGNOSIS — Z792 Long term (current) use of antibiotics: Secondary | ICD-10-CM | POA: Diagnosis not present

## 2015-03-21 HISTORY — DX: Morbid (severe) obesity due to excess calories: E66.01

## 2015-03-21 MED ORDER — HYDROCODONE-ACETAMINOPHEN 5-325 MG PO TABS: 1.0000 | ORAL_TABLET | Freq: Four times a day (QID) | ORAL | Status: DC | PRN

## 2015-03-21 NOTE — ED Notes (Signed)
EDP in to see pt prior to RN assessment, see MD notes, pending orders.

## 2015-03-21 NOTE — ED Notes (Addendum)
Dr. Read DriversMolpus in to see pt, xray results reviewed. Pt reports pain since Monday fall, taking meloxicam BID.

## 2015-03-21 NOTE — ED Notes (Addendum)
Pt reports one week history of left knee pain after a fall last Monday. Reports fall was a trip and fall event. 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 Pt reports taking Doxycycline since 10/17 for an abscess to left axilla.

## 2015-03-21 NOTE — ED Notes (Signed)
Pt ambulatory to exam room, alert, NAD, calm, interactive, no dyspnea noted, slow steady gait, minimal limp with grimace.

## 2015-03-21 NOTE — ED Provider Notes (Addendum)
CSN: [id number]     Arrival date & time 03/21/15  0139 History   First MD Initiated Contact with Patient 03/21/15 0148     Chief Complaint  Patient presents with  . Knee Pain     (Consider location/radiation/quality/duration/timing/severity/associated sxs/prior Treatment) HPI  This is a 24 year old female who is morbidly obese. She fell 6 days ago striking her left knee against concrete. Since then she has had pain in her left knee. Specifically the pain is in her left prepatellar area and left distal anterolateral thigh. 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 Pain is worse with palpation or movement. She has been taking meloxicam without adequate relief. He denies other injury.  Past Medical History  Diagnosis Date  . Chronic bronchitis (HCC)   . Anemia   . Depression   . Anxiety   . History of ear infections   . History of chicken pox   . Arthritis   . Migraines   . Morbid obesity [facility])    Past Surgical History  Procedure Laterality Date  . Tonsillectomy    . Adenoidectomy    . Tympanostomy tube placement    . Cholecystectomy N/A 06/09/2014    Procedure: LAPAROSCOPIC CHOLECYSTECTOMY ;  Surgeon: Emelia Loron, MD;  Location[facility];  Service: General;  Laterality: N/A;   Family History  Problem Relation Age of Onset  . Diabetes Mother   . Hyperlipidemia Mother   . Hypertension Mother   . Depression Mother   . Alcohol abuse Mother   . Drug abuse Mother   . Heart attack Neg Hx   . Sudden death Neg Hx   . Depression Father   . Alcohol abuse Father   . Drug abuse Father   . Cancer Maternal Aunt     prostate  . Heart disease Maternal Aunt   . Alcohol abuse Maternal Grandmother   . Hyperlipidemia Maternal Grandmother   . Hypertension Maternal Grandmother   . Hyperlipidemia Paternal Grandmother   . Hyperlipidemia Paternal Grandfather    Social History  Substance Use Topics   . Smoking status: Never Smoker   . Smokeless tobacco: None  . Alcohol Use: 0.0 oz/week    0 Standard drinks or equivalent per week     Comment: occ / holidays   OB History    No data available     Review of Systems  All other systems reviewed and are negative.   Allergies  Penicillins  Home Medications   Prior to Admission medications   Medication Sig Start Date End Date Taking? Authorizing Provider  albuterol (PROVENTIL HFA;VENTOLIN HFA) 108 (90 BASE) MCG/ACT inhaler Inhale 2 puffs into the lungs every 6 (six) hours as needed for wheezing or shortness of breath. 03/15/15  Yes Sandford Craze, NP  doxycycline (VIBRA-TABS) 100 MG tablet Take 1 tablet (100 mg total) by mouth 2 (two) times daily. 03/15/15  Yes Sandford Craze, NP  meloxicam (MOBIC) 7.5 MG tablet Take 1 tablet (7.5 mg total) by mouth daily. 03/15/15  Yes Sandford Craze, NP  metoprolol tartrate (LOPRESSOR) 25 MG tablet Take 1 tablet (25 mg total) by mouth 2 (two) times daily. 03/15/15  Yes Sandford Craze, NP  norgestimate-ethinyl estradiol (ORTHO-CYCLEN,SPRINTEC,PREVIFEM) 0.25-35 MG-MCG tablet Take 1 tablet by mouth daily. 03/15/15  Yes Sandford Craze, NP  SUMAtriptan (IMITREX) 50 MG tablet Take 1 tablet (50 mg total) by mouth every 2 (two) hours  as needed. May repeat in 2 hours if headache persists or recurs. (max 2 tabs/24hrs) 03/15/15  Yes Sandford CrazeMelissa O'Sullivan, NP   BP 175/74 mmHg  Pulse 77  Temp(Src) 98.1 F (36.7 C) (Oral)  Resp 20  Ht 5' (1.524 m)  Wt 343 lb (155.584 kg)  BMI 66.99 kg/m2  SpO2 100%  LMP 01/06/2015   Physical Exam  General: Well-developed, morbidly obese female in no acute distress; appearance consistent with age of record HENT: normocephalic; atraumatic Eyes: pupils equal, round and reactive to light; extraocular muscles intact Neck: supple Heart: regular rate and rhythm Lungs: clear to auscultation bilaterally Abdomen: soft; obese Extremities: No deformity; full  range of motion; pulses normal; ecchymosis and tenderness of left patella, left anteromedial distal thigh tenderness; left knee joint stable with minimal pain on lateral or medial stress or with or anterior posterior stress Neurologic: Awake, alert and oriented; motor function intact in all extremities and symmetric; no facial droop Skin: Warm and dry Psychiatric: Normal mood and affect    ED Course  Procedures (including critical care time)   MDM  Nursing notes and vitals signs, including pulse oximetry, reviewed.  Summary of this visit's results, reviewed by myself:  Imaging Studies: Dg Knee Complete 4 Views Left  03/21/2015  CLINICAL DATA:  Fall on concrete 1 week prior injuring left knee. Persistent and worsening left knee pain. Unable to bear weight. EXAM: LEFT KNEE - COMPLETE 4+ VIEW COMPARISON:  None. FINDINGS: No fracture or dislocation. The alignment and joint spaces are maintained. There is no joint effusion. No focal soft tissue abnormality. IMPRESSION: No fracture or dislocation of the left knee. Electronically Signed   By: Rubye OaksMelanie  Ehinger M.D.   On: 03/21/2015 02:19   Left knee injury, initial encounter      Paula LibraJohn Gissell Barra, MD 03/21/15 0230  Paula LibraJohn Kenndra Morris, MD 05/03/15 2240

## 2015-03-22 ENCOUNTER — Ambulatory Visit (INDEPENDENT_AMBULATORY_CARE_PROVIDER_SITE_OTHER): Payer: Commercial Managed Care - PPO | Admitting: Family

## 2015-03-22 ENCOUNTER — Encounter: Payer: Self-pay | Admitting: Family

## 2015-03-22 VITALS — BP 134/80 | HR 66 | Temp 97.9°F | Resp 18 | Ht 60.0 in | Wt 341.0 lb

## 2015-03-22 DIAGNOSIS — M25562 Pain in left knee: Secondary | ICD-10-CM

## 2015-03-22 DIAGNOSIS — L732 Hidradenitis suppurativa: Secondary | ICD-10-CM

## 2015-03-22 DIAGNOSIS — R300 Dysuria: Secondary | ICD-10-CM | POA: Diagnosis not present

## 2015-03-22 LAB — POCT URINALYSIS DIPSTICK
Bilirubin, UA: NEGATIVE
GLUCOSE UA: NEGATIVE
Ketones, UA: NEGATIVE
Leukocytes, UA: NEGATIVE
NITRITE UA: NEGATIVE
Spec Grav, UA: >=1.03
UROBILINOGEN UA: NEGATIVE
pH, UA: 5.5

## 2015-03-22 NOTE — Patient Instructions (Addendum)
Continue meloxicam as needed. Call if knee pain worsens or if not improved in 1 week.  Please schedule complete physical at your convenience.

## 2015-03-22 NOTE — Addendum Note (Signed)
Addended by: Mervin KungFERGERSON, Tabathia Knoche A on: 03/22/2015 10:35 AM   Modules accepted: Orders

## 2015-03-22 NOTE — Progress Notes (Signed)
Pre visit review using our clinic review tool, if applicable. No additional management support is needed unless otherwise documented below in the visit note. 

## 2015-03-22 NOTE — Progress Notes (Signed)
Subjective:    Patient ID: Sheila Donovan, female    DOB: 1990-07-04, 24 y.o.   MRN: [id number]  HPI  Sheila Donovan is a 24 yr old female who presents today for follow up of hidradentis- right axilla. She was placed on doxycycline last visit.   Fall- Pt was seen yesterday in the ED following a fall. ED records are reviewed.  Pt fell 1 week ago on the left knee. X ray was negative for fracture.    She reports + dysuria and bilateral lower abdominal pain.  + heavy menses today.   Review of Systems    see HPI  Past Medical History  Diagnosis Date  . Chronic bronchitis (HCC)   . Anemia   . Depression   . Anxiety   . History of ear infections   . History of chicken pox   . Arthritis   . Migraines   . Morbid obesity (HCC)     Social History   Social History  . Marital Status: Single    Spouse Name: N/A  . Number of Children: N/A  . Years of Education: N/A   Occupational History  . Not on file.   Social History Main Topics  . Smoking status: Never Smoker   . Smokeless tobacco: Not on file  . Alcohol Use: 0.0 oz/week    0 Standard drinks or equivalent per week     Comment: occ / holidays  . Drug Use: No  . Sexual Activity: Not on file   Other Topics Concern  . 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        Past Surgical History  Procedure Laterality Date  . Tonsillectomy    . Adenoidectomy    . Tympanostomy tube placement    . Cholecystectomy N/A 06/09/2014    Procedure: LAPAROSCOPIC CHOLECYSTECTOMY ;  Surgeon: Emelia Loron, MD;  Location: [facility] OR;  Service: General;  Laterality: N/A;    Family History  Problem Relation Age of Onset  . Diabetes Mother   . Hyperlipidemia Mother   . Hypertension Mother   . Depression Mother   . Alcohol abuse Mother   . Drug abuse Mother   . Heart attack Neg Hx   . Sudden death Neg Hx     . Depression Father   . Alcohol abuse Father   . Drug abuse Father   . Cancer Maternal Aunt     prostate  . Heart disease Maternal Aunt   . Alcohol abuse Maternal Grandmother   . Hyperlipidemia Maternal Grandmother   . Hypertension Maternal Grandmother   . Hyperlipidemia Paternal Grandmother   . Hyperlipidemia Paternal Grandfather     Allergies  Allergen Reactions  . Penicillins Hives    Current Outpatient Prescriptions on File Prior to Visit  Medication Sig Dispense Refill  . albuterol (PROVENTIL HFA;VENTOLIN HFA) 108 (90 BASE) MCG/ACT inhaler Inhale 2 puffs into the lungs every 6 (six) hours as needed for wheezing or shortness of breath. 1 Inhaler 2  . HYDROcodone-acetaminophen (NORCO) 5-325 MG tablet Take 1-2 tablets by mouth every 6 (six) hours as needed (for pain). 10 tablet 0  . meloxicam (MOBIC) 7.5 MG tablet Take 1 tablet (7.5 mg total) by mouth daily. 14 tablet 0  . metoprolol tartrate (LOPRESSOR) 25 MG tablet Take 1 tablet (25 mg total)  by mouth 2 (two) times daily. 60 tablet 2  . norgestimate-ethinyl estradiol (ORTHO-CYCLEN,SPRINTEC,PREVIFEM) 0.25-35 MG-MCG tablet Take 1 tablet by mouth daily. 1 Package 5  . SUMAtriptan (IMITREX) 50 MG tablet Take 1 tablet (50 mg total) by mouth every 2 (two) hours as needed. May repeat in 2 hours if headache persists or recurs. (max 2 tabs/24hrs) 10 tablet 3   No current facility-administered medications on file prior to visit.    BP 134/80 mmHg  Pulse 66  Temp(Src) 97.9 F (36.6 C) (Oral)  Resp 18  Ht 5' (1.524 m)  Wt 341 lb (154.677 kg)  BMI 66.60 kg/m2  LMP 03/21/2015    Objective:   Physical Exam  Constitutional: She is oriented to person, place, and time. She appears well-developed and well-nourished. No distress.  Eyes: No scleral icterus.  Abdominal: Soft. There is no tenderness.  Musculoskeletal: She exhibits no edema.  + Left knee + bruising, + swelling overlying left patella, + tenderness to palpation   Neurological: She is alert and oriented to person, place, and time.  Psychiatric: She has a normal mood and affect. Her behavior is normal. Judgment and thought content normal.  Skin- abscess right axilla is healed and non-tender. Area of fluctuance right upper inner arm is resolved.        Assessment & Plan:  Dysuria-  Obtain urine culture, UA. Rule out UTI.    Knee pain- suspect soft tissue injury, advised pt to continue meloxicam.  Call if pain/swelling does not improve in 1 week. Would plan referral to orthopedics at that time.    Hidradenitis- resolved following doxy.

## 2015-03-23 ENCOUNTER — Encounter: Payer: Self-pay | Admitting: Family

## 2015-03-23 LAB — URINE CULTURE: Colony Count: 30000

## 2015-03-31 ENCOUNTER — Encounter: Payer: Self-pay | Admitting: Family

## 2015-03-31 DIAGNOSIS — M25552 Pain in left hip: Secondary | ICD-10-CM

## 2015-04-01 ENCOUNTER — Ambulatory Visit (INDEPENDENT_AMBULATORY_CARE_PROVIDER_SITE_OTHER): Payer: Commercial Managed Care - PPO | Admitting: Family Medicine

## 2015-04-01 ENCOUNTER — Encounter: Payer: Self-pay | Admitting: Family Medicine

## 2015-04-01 ENCOUNTER — Ambulatory Visit (HOSPITAL_BASED_OUTPATIENT_CLINIC_OR_DEPARTMENT_OTHER)
Admission: RE | Admit: 2015-04-01 | Discharge: 2015-04-01 | Disposition: A | Discharge status: Home / Self Care | Payer: Commercial Managed Care - PPO | Source: Ambulatory Visit | Attending: Family Medicine | Admitting: Family Medicine

## 2015-04-01 VITALS — BP 152/83 | HR 77 | Ht 61.0 in | Wt 341.0 lb

## 2015-04-01 DIAGNOSIS — M25552 Pain in left hip: Secondary | ICD-10-CM

## 2015-04-01 DIAGNOSIS — S79912A Unspecified injury of left hip, initial encounter: Secondary | ICD-10-CM

## 2015-04-01 DIAGNOSIS — M25562 Pain in left knee: Secondary | ICD-10-CM

## 2015-04-01 MED ORDER — HYDROCODONE-ACETAMINOPHEN 5-325 MG PO TABS: 1.0000 | ORAL_TABLET | Freq: Four times a day (QID) | ORAL | Status: DC | PRN

## 2015-04-01 NOTE — Patient Instructions (Addendum)
You have a severe knee contusion (possible meniscus tear though these are treated similarly). You strained the external rotators of your left hip and have a contusion here as well. Icing 15 minutes at a time 3-4 times a day. Ibuprofen 600mg  three times a day with food OR aleve 2 tabs twice a day with food for pain and inflammation. Norco as needed at bedtime for pain. ACE wrap for swelling, support. Start physical therapy and do home exercises on days you don't go to therapy. Follow up with me in 4 weeks

## 2015-04-05 NOTE — Progress Notes (Signed)
PCP and consultation requested by: Sheila Donovan'SULLIVAN,MELISSA S., NP  Subjective:   HPI: Patient is a 24 y.o. female here for left hip, knee injury.  Patient reports on 10/17 she walked into the garage, tripped over a case of water in the dark and fell forward onto left knee and turned some, fell onto left hip anterolaterally. Pain anterior hip and posterior hip at 9/10 level. Worse with walking, bearing weight. Has to stand a lot at work. Pain is sharp, constant. No prior injuries. Also with superolateral left knee pain at 6/10 level. Has tried icing knee, takes pain medication at night. No skin changes, fever, other complaints. Radiographs of knee negative.  Past Medical History  Diagnosis Date  . Chronic bronchitis (HCC)   . Anemia   . Depression   . Anxiety   . History of ear infections   . History of chicken pox   . Arthritis   . Migraines   . Morbid obesity (HCC)     Current Outpatient Prescriptions on File Prior to Visit  Medication Sig Dispense Refill  . albuterol (PROVENTIL HFA;VENTOLIN HFA) 108 (90 BASE) MCG/ACT inhaler Inhale 2 puffs into the lungs every 6 (six) hours as needed for wheezing or shortness of breath. 1 Inhaler 2  . meloxicam (MOBIC) 7.5 MG tablet Take 1 tablet (7.5 mg total) by mouth daily. 14 tablet 0  . metoprolol tartrate (LOPRESSOR) 25 MG tablet Take 1 tablet (25 mg total) by mouth 2 (two) times daily. 60 tablet 2  . norgestimate-ethinyl estradiol (ORTHO-CYCLEN,SPRINTEC,PREVIFEM) 0.25-35 MG-MCG tablet Take 1 tablet by mouth daily. 1 Package 5  . SUMAtriptan (IMITREX) 50 MG tablet Take 1 tablet (50 mg total) by mouth every 2 (two) hours as needed. May repeat in 2 hours if headache persists or recurs. (max 2 tabs/24hrs) 10 tablet 3   No current facility-administered medications on file prior to visit.    Past Surgical History  Procedure Laterality Date  . Tonsillectomy    . Adenoidectomy    . Tympanostomy tube placement    . Cholecystectomy N/A  06/09/2014    Procedure: LAPAROSCOPIC CHOLECYSTECTOMY ;  Surgeon: Emelia LoronMatthew Wakefield, MD;  [facility];  Service: General;  Laterality: N/A;    Allergies  Allergen Reactions  . Penicillins Hives    Social History   Social History  . Marital Status: Single    Spouse Name: N/A  . Number of Children: N/A  . Years of Education: N/A   Occupational History  . Not on file.   Social History Main Topics  . Smoking status: Never Smoker   . Smokeless tobacco: Not on file  . Alcohol Use: 0.0 oz/week    0 Standard drinks or equivalent per week     Comment: occ / holidays  . Drug Use: No  . Sexual Activity: Not on file   Other Topics Concern  . 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        Family History  Problem Relation Age of Onset  . Diabetes Mother   . Hyperlipidemia Mother   . Hypertension Mother   . Depression Mother   . Alcohol abuse Mother   . Drug abuse Mother   . Heart attack Neg Hx   . Sudden death Neg Hx   . Depression Father   . Alcohol abuse Father   .  Drug abuse Father   . Cancer Maternal Aunt     prostate  . Heart disease Maternal Aunt   . Alcohol abuse Maternal Grandmother   . Hyperlipidemia Maternal Grandmother   . Hypertension Maternal Grandmother   . Hyperlipidemia Paternal Grandmother   . Hyperlipidemia Paternal Grandfather     BP 152/83 mmHg  Pulse 77  Ht  (1.549 m)  Wt 341 lb (154.677 kg)  BMI 64.46 kg/m2  LMP 03/21/2015  Review of Systems: See HPI above.    Objective:  Physical Exam:  Gen: NAD  Left knee: No gross deformity, ecchymoses, swelling. Medial and lateral joint line tenderness. ROM 0 - 90 degrees. Negative ant/post drawers. Negative valgus/varus testing. Negative lachmanns. Negative mcmurrays, apleys, patellar apprehension. NV intact distally.  Right knee: FROM without  pain.  Left hip: No gross deformity, swelling, bruising. TTP left buttock, ASIS.  No other tenderness. FROM. Strength LEs 5/5 all muscle groups except 4/5 with hip abduction.   Negative SLRs. Sensation intact to light touch bilaterally. Negative logroll bilateral hips Negative fabers and piriformis stretches.    Assessment & Plan:  1. Left knee injury - independent review of radiographs negative for acute abnormalities.  Consistent with contusion, less likely meniscus tear.  Icing, nsaids with norco as needed.  ACE wrap for support.  Start physical therapy, home exercises.  F/u in 4 weeks.  2. Left hip injury - independent review of radiographs negative for acute abnormalities.  Start physical therapy and home exercises.  Icing, nsaids with norco as needed.  F/u in 4 weeks.

## 2015-04-06 ENCOUNTER — Ambulatory Visit: Payer: Commercial Managed Care - PPO | Attending: Family Medicine | Admitting: Physical Therapy

## 2015-04-06 DIAGNOSIS — R29898 Other symptoms and signs involving the musculoskeletal system: Secondary | ICD-10-CM | POA: Diagnosis present

## 2015-04-06 DIAGNOSIS — R262 Difficulty in walking, not elsewhere classified: Secondary | ICD-10-CM | POA: Diagnosis present

## 2015-04-06 DIAGNOSIS — M25562 Pain in left knee: Secondary | ICD-10-CM | POA: Insufficient documentation

## 2015-04-06 DIAGNOSIS — M25552 Pain in left hip: Secondary | ICD-10-CM | POA: Diagnosis not present

## 2015-04-06 DIAGNOSIS — S79912A Unspecified injury of left hip, initial encounter: Secondary | ICD-10-CM | POA: Insufficient documentation

## 2015-04-06 NOTE — Addendum Note (Signed)
Addended by: Marry GuanKREIS, JOANNE M on: 04/06/2015 07:08 PM   Modules accepted: Orders

## 2015-04-06 NOTE — Assessment & Plan Note (Signed)
independent review of radiographs negative for acute abnormalities.  Start physical therapy and home exercises.  Icing, nsaids with norco as needed.  F/u in 4 weeks.

## 2015-04-06 NOTE — Assessment & Plan Note (Signed)
independent review of radiographs negative for acute abnormalities.  Consistent with contusion, less likely meniscus tear.  Icing, nsaids with norco as needed.  ACE wrap for support.  Start physical therapy, home exercises.  F/u in 4 weeks.

## 2015-04-06 NOTE — Therapy (Addendum)
[facility] [address]  [place], [place], [address] Phone: [phone]   Fax:  [phone]  Physical Therapy Evaluation  Patient Details  Name: Sheila Donovan MRN: [id number] Date of Birth: 1990-07-24 Referring Provider: Dene Gentry, MD  Encounter Date: 04/06/2015      PT End of Session - 04/06/15 1832    Visit Number 1   Number of Visits 12   Date for PT Re-Evaluation 05/18/15   PT Start Time 3614   PT Stop Time 1701   PT Time Calculation (min) 46 min   Activity Tolerance Patient tolerated treatment well;Patient limited by pain   Behavior During Therapy [facility] for tasks assessed/performed      Past Medical History  Diagnosis Date  . Chronic bronchitis ([place])   . Anemia   . Depression   . Anxiety   . History of ear infections   . History of chicken pox   . Arthritis   . Migraines   . Morbid obesity [facility])     Past Surgical History  Procedure Laterality Date  . Tonsillectomy    . Adenoidectomy    . Tympanostomy tube placement    . Cholecystectomy N/A 06/09/2014    Procedure: LAPAROSCOPIC CHOLECYSTECTOMY ;  Surgeon: Rolm Bookbinder, MD;  Location: [facility];  Service: General;  Laterality: N/A;    There were no vitals filed for this visit.  Visit Diagnosis:  Left hip pain  Left knee pain  Weakness of left leg  Difficulty walking      Subjective Assessment - 04/06/15 1616    Subjective Patient tripped when attempting to step over a case of water in the dark on 03/15/15 falling onto left side, resulting in contusions to left forearm, hip and knee. 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 Dr. Barbaraann Barthel restricted work to seated position x 4 wks. Pain limits climbing stairs or lifting her dog.    Limitations Sitting   How long can you sit comfortably? 30 minutes   How long can you stand comfortably? 5  minutes   How long can you walk comfortably? <100 ft   Diagnostic tests 03/21/15 x-ray left knee: No fracture or dislocation of the left knee; 04/01/15 x-ray left hip: There is no evidence of hip fracture or dislocation. There is no evidence of arthropathy or other focal bone abnormality.   Patient Stated Goals "Be painfree"   Currently in Pain? Yes   Pain Score 8    Pain Location Hip   Pain Orientation Left;Lateral   Pain Descriptors / Indicators --  Grinding   Pain Type Acute pain   Pain Onset 1 to 4 weeks ago   Pain Frequency Constant   Aggravating Factors  Standing, walking up stairs, bending   Pain Relieving Factors Short periods of sitting, Lying on right side   Effect of Pain on Daily Activities Standing at work, walking around at work, walking the dog   Multiple Pain Sites Yes   Pain Score 6   Pain Location Knee   Pain Orientation Left;Anterior;Lateral   Pain Descriptors / Indicators Sore   Pain Type Acute pain   Pain Onset 1 to 4 weeks ago   Pain Frequency Constant   Aggravating Factors  Standing, walking up stairs, sitting   Pain Relieving Factors Nothing   Effect of Pain on Daily Activities Limits sitting tolerance  [facility] PT Assessment - 04/06/15 1614    Assessment   Medical Diagnosis Left hip & knee pain   Referring Provider Dene Gentry, MD   Onset Date/Surgical Date 03/15/15   Next MD Visit 04/29/15   Prior Therapy none   Balance Screen   Has the patient fallen in the past 6 months Yes   How many times? 1   Has the patient had a decrease in activity level because of a fear of falling?  No   Is the patient reluctant to leave their home because of a fear of falling?  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   Leisure Sewing   Observation/Other Assessments   Focus on Therapeutic Outcomes (FOTO)  Hip - 27% (73% limitation); Predicted 57% (43% limitation)   ROM / Strength   AROM / PROM / Strength Strength;AROM   AROM   Overall AROM Comments LE ROM WFL except limited due to obesity   Strength   Strength Assessment Site Hip;Knee   Right/Left Hip Right;Left   Right Hip Flexion 5/5   Right Hip Extension 4+/5   Right Hip ABduction 5/5   Right Hip ADduction 5/5   Left Hip Flexion 4/5   Left Hip Extension 4/5   Left Hip ABduction 4/5   Left Hip ADduction 4/5   Right/Left Knee Right;Left   Right Knee Flexion 5/5   Right Knee Extension 5/5   Left Knee Flexion 4+/5   Left Knee Extension 4+/5   Flexibility   Soft Tissue Assessment /Muscle Length yes   Hamstrings moderately tight on left   Quadriceps mildly tight on left   ITB moderately tight on left   Palpation   Palpation comment ttp over superiolateral patella, lateral joint line, distal ITB, anterior/lateral hip/groin and mid lateral hamstrings on left. Denies ttp in buttocks (piriformis).   Ambulation/Gait   Gait Pattern Wide base of support;Lateral trunk lean to right  bilateral hip ER                PT Education - 04/06/15 1855    Education provided Yes   Education Details PT eval findings & POC, Initial HEP   Person(s) Educated Patient   Methods Explanation;Demonstration;Handout   Comprehension Verbalized understanding;Returned demonstration;Need further instruction;Other (comment)  Patient distracted by presence of son who kept interrupting              PT Long Term Goals - 04/06/15 1857    PT LONG TERM GOAL #1   Title Patient will be independent with latest HEP as indicated (05/18/15)   Time 6   Period Weeks   Status New   PT LONG TERM GOAL #2   Title Patient will  demonstrate left LE strength 4+/5 or greater without pain (05/18/15)   Time 6   Period Weeks   Status New   PT LONG TERM GOAL #3   Title Patient will be able to work full shift without restrictions without increased pain (05/18/15)   Time 6   Period Weeks   Status New  Plan - 04/06/15 1833    Clinical Impression Statement Patient is a 24 y/o female who presents to OP PT with left hip and knee pain stemming from contusions/injuries sustained from a trip and fall where she landed on her left side. Patient reports anteriolateral hip and superiolateral knee pain with ttp in these areas as well as distal ITB and mid lateral hamstrings. Joint ROM WFL except limitations due to obesity and muscle tighness in hip flexors, hamstrings and ITB band. Strength slightly limited on left (4/5 in hip and 4+/5 in knee), potentially in part due to guarding from pain.   Pt will benefit from skilled therapeutic intervention in order to improve on the following deficits Pain;Impaired flexibility;Decreased strength;Difficulty walking;Abnormal gait;Decreased activity tolerance   Rehab Potential Good   Clinical Impairments Affecting Rehab Potential Obesity   PT Frequency 2x / week   PT Duration 6 weeks   PT Treatment/Interventions Therapeutic exercise;Manual techniques;Passive range of motion;Ultrasound;Moist Heat;Cryotherapy;Electrical Stimulation;Iontophoresis 66m/ml Dexamethasone;Gait training;Stair training;Patient/family education   PT Next Visit Plan Review HEP stretches; Left LE flexibility/strengthening, Modalities PRN   Consulted and Agree with Plan of Care Patient         Problem List Patient Active Problem List   Diagnosis Date Noted  . Left knee pain 04/06/2015  . Injury of left hip 04/06/2015  . B12 deficiency 03/18/2015  . Iron deficiency 03/18/2015  . Asthma 03/15/2015  . 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 Migraines 03/15/2015  . Extreme obesity ([place] 06/10/2014  .  Anemia   . Bipolar depression ([place]     JPercival Spanish PT, MPT 04/06/2015, 7:05 PM  [facility] [place] [address]Phone: [phone]  Fax:  [phone] Name: BMilanni AyubMRN: [id number] of Birth: [birth year]  PHYSICAL THERAPY DISCHARGE SUMMARY  Visits from Start of Care: 1  Current functional level related to goals / functional outcomes:  Refer to above eval for current functional level. No goals met as patient seen for eval only and cancelled all further visits due to out-of-pocket cost with insurance.   Remaining deficits:  Unchanged from eval   Education / Equipment:  Initial HEP Plan: Patient agrees to discharge.  Patient goals were not met. Patient is being discharged due to financial reasons.  ?????       JPercival Spanish PT, MPT 04/08/2015, 10:39 AM  [facility] [place] [address]Phone: [phone]  Fax:  [phone]

## 2015-04-08 ENCOUNTER — Encounter: Payer: Commercial Managed Care - PPO | Attending: Family | Admitting: Dietician

## 2015-04-08 ENCOUNTER — Encounter: Payer: Self-pay | Admitting: Dietician

## 2015-04-08 DIAGNOSIS — Z713 Dietary counseling and surveillance: Secondary | ICD-10-CM | POA: Insufficient documentation

## 2015-04-08 DIAGNOSIS — Z6841 Body Mass Index (BMI) 40.0 and over, adult: Secondary | ICD-10-CM | POA: Insufficient documentation

## 2015-04-08 DIAGNOSIS — R7303 Prediabetes: Secondary | ICD-10-CM | POA: Diagnosis not present

## 2015-04-08 NOTE — Patient Instructions (Addendum)
Enquire about bariatric surgery options call [phone]. Keep up your exercise! Rethink what you drink.  Choose water and unsweetened beverages. Choose whole grains. Ask your doctor to check you for PCOS. Consider getting your vitamin D level checked.   Consider supplementing with Inositol 1.2-4 grams/day Continue Salmon which is a good source of omega-e (daily recommendations 1500 mg - 4 grams daily) Consider a multi vitamin. Be a mindful eater.  Nourish your body.  Eat regularly.  Stop when you are full.

## 2015-04-08 NOTE — Progress Notes (Signed)
Medical Nutrition Therapy:  Appt start time: 1445 end time:  1600.   Assessment:  Primary concerns today: Patient is here alone.  She is here due to obesity.  She states that she has looked into gastric bypass in the past (while in [place]) and decided against this but has had a cousin get the gastric sleeve and is interested in this.     Patient lives with her aunt and uncle and 377 yo son since moving from [place] 7 years ago.  Aunt, Uncle (who is a Investment banker, operationalchef) and patient do the shopping and cooking. She currently works as a Corporate treasurerfront desk manager for [employer] and flips her schedule between first and second.  She has had 2 years college in medical billing but cannot get a job in this here.  She is in school for veterinary tech currently.    Highest weight was 360 lbs and lowest 264 lbs at 24 yo before pregnancy with her son.  She has been fluctuating between 330-360 lbs for the last 2 years.  Current weight of 346 lbs.  She states that her depression impacts her weight because she stops doing things, walks, drinks increased sweet tea because she stops caring.  She is awaiting a mental health appointment.   HgbA1C 5.9% 03/15/15 which meets criteria for prediabetes.  She also had protein in her urine.  She had her gallbladder removed 05/2014.  She has very irregular periods and hirsutism.   Preferred Learning Style:   No preference indicated   Learning Readiness:   Ready  MEDICATIONS: see list   DIETARY INTAKE: Usually only eats chicken and salmon and very little beef.  She reports growing up with muslim parents and does not eat pork.  24-hr recall:  B ( AM): skips often OR egg, bagel, and yogurt Snk ( AM): none  L ( PM): skips OR grilled chicken, rice, green beans OR salad with chicken Snk ( PM): yogurt D ( PM): chicken or salmon, rice or sweet potato, and vegetables Snk ( PM): none Beverages: lemon tea with 1 packet sugar/honey, diet green tea, sweet tea, water, occasional  soda  Usual physical activity: 3 mile walk each night. She tries to do this daily.  Estimated energy needs: 1600 calories 180 g carbohydrates 100 g protein 53 g fat  Progress Towards Goal(s):  In progress.   Nutritional Diagnosis:  NB-1.1 Food and nutrition-related knowledge deficit As related to balance of carbohydrate, protein and fat as well as exercise for weight control and blood sugar control.  As evidenced by diet hx and elevated A1C.    Intervention:  Nutrition Counseling/education related to healthy nutrition.  Discussed the importance of regularly scheduled meals, increased non starchy vegetables, lean protein as well as continued exercise and mindful eating.  counseling and diabetes education initiated.  Discussed basic physiology of Diabetes, target BG ranges pre and post meals, and A1c as well as carbohydrate containing foods,  Portion sizes and label reading.    Enquire about bariatric surgery options call [phone]. Keep up your exercise! Rethink what you drink.  Choose water and unsweetened beverages. Choose whole grains. Ask your doctor to check you for PCOS. Consider getting your vitamin D level checked.   Consider supplementing with Inositol 1.2-4 grams/day Continue Salmon which is a good source of omega-e (daily recommendations 1500 mg - 4 grams daily) Consider a multi vitamin. Be a mindful eater.  Nourish your body.  Eat regularly.  Stop when you are full.  Teaching Method Utilized:  Visual Auditory Hands on  Handouts given during visit include: Living Well with Diabetes Meal Plan Card Breakfast  Snack list  Barriers to learning/adherence to lifestyle change: depression  Demonstrated degree of understanding via:  Teach Back   Monitoring/Evaluation:  Dietary intake, exercise, label reading, and body weight in 1 month(s).

## 2015-04-19 ENCOUNTER — Ambulatory Visit: Payer: Commercial Managed Care - PPO | Admitting: Rehabilitation

## 2015-04-20 ENCOUNTER — Encounter: Payer: Commercial Managed Care - PPO | Admitting: Physical Therapy

## 2015-04-26 ENCOUNTER — Encounter: Payer: Commercial Managed Care - PPO | Admitting: Physical Therapy

## 2015-04-28 ENCOUNTER — Encounter: Payer: Commercial Managed Care - PPO | Admitting: Family

## 2015-04-29 ENCOUNTER — Encounter: Payer: Commercial Managed Care - PPO | Admitting: Physical Therapy

## 2015-04-29 ENCOUNTER — Ambulatory Visit: Payer: Commercial Managed Care - PPO | Admitting: Family Medicine

## 2015-05-03 ENCOUNTER — Ambulatory Visit: Payer: Commercial Managed Care - PPO | Admitting: Family Medicine

## 2015-05-03 ENCOUNTER — Encounter: Payer: Commercial Managed Care - PPO | Admitting: Physical Therapy

## 2015-05-06 ENCOUNTER — Ambulatory Visit: Payer: Commercial Managed Care - PPO | Admitting: Dietician

## 2015-05-06 ENCOUNTER — Ambulatory Visit: Payer: Commercial Managed Care - PPO | Admitting: Family Medicine

## 2015-05-10 ENCOUNTER — Encounter: Payer: Commercial Managed Care - PPO | Admitting: Physical Therapy

## 2015-05-13 ENCOUNTER — Encounter: Payer: Commercial Managed Care - PPO | Admitting: Physical Therapy

## 2015-05-17 ENCOUNTER — Encounter: Payer: Commercial Managed Care - PPO | Admitting: Physical Therapy

## 2015-05-18 ENCOUNTER — Encounter (HOSPITAL_BASED_OUTPATIENT_CLINIC_OR_DEPARTMENT_OTHER): Payer: Self-pay | Admitting: Emergency Medicine

## 2015-05-18 ENCOUNTER — Emergency Department (HOSPITAL_BASED_OUTPATIENT_CLINIC_OR_DEPARTMENT_OTHER): Payer: Commercial Managed Care - PPO

## 2015-05-18 ENCOUNTER — Emergency Department (HOSPITAL_BASED_OUTPATIENT_CLINIC_OR_DEPARTMENT_OTHER)
Admission: EM | Admit: 2015-05-18 | Discharge: 2015-05-18 | Disposition: A | Discharge status: Home / Self Care | Payer: Commercial Managed Care - PPO | Attending: Emergency Medicine | Admitting: Emergency Medicine

## 2015-05-18 DIAGNOSIS — Z880 Allergy status to penicillin: Secondary | ICD-10-CM | POA: Diagnosis not present

## 2015-05-18 DIAGNOSIS — Z8709 Personal history of other diseases of the respiratory system: Secondary | ICD-10-CM | POA: Insufficient documentation

## 2015-05-18 DIAGNOSIS — G43909 Migraine, unspecified, not intractable, without status migrainosus: Secondary | ICD-10-CM | POA: Diagnosis not present

## 2015-05-18 DIAGNOSIS — Z8659 Personal history of other mental and behavioral disorders: Secondary | ICD-10-CM | POA: Insufficient documentation

## 2015-05-18 DIAGNOSIS — R079 Chest pain, unspecified: Secondary | ICD-10-CM | POA: Insufficient documentation

## 2015-05-18 DIAGNOSIS — Z793 Long term (current) use of hormonal contraceptives: Secondary | ICD-10-CM | POA: Insufficient documentation

## 2015-05-18 DIAGNOSIS — Z8619 Personal history of other infectious and parasitic diseases: Secondary | ICD-10-CM | POA: Diagnosis not present

## 2015-05-18 DIAGNOSIS — Z79899 Other long term (current) drug therapy: Secondary | ICD-10-CM | POA: Insufficient documentation

## 2015-05-18 DIAGNOSIS — R61 Generalized hyperhidrosis: Secondary | ICD-10-CM | POA: Diagnosis not present

## 2015-05-18 DIAGNOSIS — D649 Anemia, unspecified: Secondary | ICD-10-CM | POA: Insufficient documentation

## 2015-05-18 DIAGNOSIS — Z8739 Personal history of other diseases of the musculoskeletal system and connective tissue: Secondary | ICD-10-CM | POA: Diagnosis not present

## 2015-05-18 DIAGNOSIS — R42 Dizziness and giddiness: Secondary | ICD-10-CM | POA: Diagnosis not present

## 2015-05-18 DIAGNOSIS — Z8669 Personal history of other diseases of the nervous system and sense organs: Secondary | ICD-10-CM | POA: Insufficient documentation

## 2015-05-18 LAB — COMPREHENSIVE METABOLIC PANEL
ALBUMIN: 3.6 g/dL (ref 3.5–5.0)
ALK PHOS: 73 U/L (ref 38–126)
ALT: 23 U/L (ref 14–54)
ANION GAP: 6 (ref 5–15)
AST: 16 U/L (ref 15–41)
BILIRUBIN TOTAL: 0.5 mg/dL (ref 0.3–1.2)
BUN: 13 mg/dL (ref 6–20)
CO2: 25 mmol/L (ref 22–32)
Calcium: 8.6 mg/dL — ABNORMAL LOW (ref 8.9–10.3)
Chloride: 108 mmol/L (ref 101–111)
Creatinine, Ser: 0.61 mg/dL (ref 0.44–1.00)
GFR calc Af Amer: >60 mL/min (ref >60)
GFR calc non Af Amer: >60 mL/min (ref >60)
GLUCOSE: 102 mg/dL — AB (ref 65–99)
Potassium: 3.8 mmol/L (ref 3.5–5.1)
Sodium: 139 mmol/L (ref 135–145)
TOTAL PROTEIN: 6.9 g/dL (ref 6.5–8.1)

## 2015-05-18 LAB — CBC
HEMATOCRIT: 35 % — AB (ref 36.0–46.0)
HEMOGLOBIN: 10.2 g/dL — AB (ref 12.0–15.0)
MCH: 20.2 pg — ABNORMAL LOW (ref 26.0–34.0)
MCHC: 29.1 g/dL — AB (ref 30.0–36.0)
MCV: 69.3 fL — AB (ref 78.0–100.0)
Platelets: 218 10*3/uL (ref 150–400)
RBC: 5.05 MIL/uL (ref 3.87–5.11)
RDW: 17 % — ABNORMAL HIGH (ref 11.5–15.5)
WBC: 7 10*3/uL (ref 4.0–10.5)

## 2015-05-18 LAB — TROPONIN I: Troponin I: <0.03 ng/mL (ref <0.031)

## 2015-05-18 LAB — D-DIMER, QUANTITATIVE: D-Dimer, Quant: 0.31 ug/mL-FEU (ref 0.00–0.50)

## 2015-05-18 MED ORDER — SODIUM CHLORIDE 0.9 % IV SOLN
1000.0000 mL | INTRAVENOUS | Status: DC
  Administered 2015-05-18: 1000 mL via INTRAVENOUS

## 2015-05-18 MED ORDER — ASPIRIN 81 MG PO CHEW
324.0000 mg | CHEWABLE_TABLET | Freq: Once | ORAL | Status: AC
  Administered 2015-05-18: 324 mg via ORAL
  Filled 2015-05-18: qty 4

## 2015-05-18 MED ORDER — NAPROXEN 500 MG PO TABS: 500.0000 mg | ORAL_TABLET | Freq: Two times a day (BID) | ORAL | Status: DC

## 2015-05-18 MED ORDER — MORPHINE SULFATE (PF) 4 MG/ML IV SOLN
4.0000 mg | INTRAVENOUS | Status: DC | PRN
  Administered 2015-05-18: 4 mg via INTRAVENOUS
  Filled 2015-05-18: qty 1

## 2015-05-18 NOTE — ED Notes (Signed)
Pt having chest pain, started at 8 am, sudden onset.  Along with sob, numbness in hands, nausea, dizziness and diaphoresis.  Chest pain, dizziness and numbness in hands continues.  No other symptoms currently.

## 2015-05-18 NOTE — Discharge Instructions (Signed)

## 2015-05-18 NOTE — ED Notes (Signed)
Attempt x 2 to start IV unsuccessful. 

## 2015-05-18 NOTE — ED Provider Notes (Signed)
CSN: [id number]     Arrival date & time 05/18/15  4098 History   First MD Initiated Contact with Patient 05/18/15 [phone]     Chief Complaint  Patient presents with  . Chest Pain     HPI Patient developed acute sharp left-sided chest pain today at approximately 8 AM. The patient was at work when she suddenly felt very sharp pain on the left side of her chest underneath her breast. She did feel short of breath. She also felt lightheaded and diaphoretic. She had to sit down. He was given oxygen by EMS and felt slightly better but she continued to have pain in her chest. She is also noticed some numbness in her hand that comes and goes. Patient denies any trouble with fevers or coughing. No injuries. No vomiting or diarrhea. No leg swelling. Prior history of heart disease, PE or DVT. No family history of heart or lung disease. She denies ever having similar symptoms in the past Past Medical History  Diagnosis Date  . Chronic bronchitis (HCC)   . Anemia   . Depression   . Anxiety   . History of ear infections   . History of chicken pox   . Arthritis   . Migraines   . Morbid obesity [facility])    Past Surgical History  Procedure Laterality Date  . Tonsillectomy    . Adenoidectomy    . Tympanostomy tube placement    . Cholecystectomy N/A 06/09/2014    Procedure: LAPAROSCOPIC CHOLECYSTECTOMY ;  Surgeon: Emelia Loron, MD;  Location: [facility] OR;  Service: General;  Laterality: N/A;   Family History  Problem Relation Age of Onset  . Diabetes Mother   . Hyperlipidemia Mother   . Hypertension Mother   . Depression Mother   . Alcohol abuse Mother   . Drug abuse Mother   . Heart attack Neg Hx   . Sudden death Neg Hx   . Depression Father   . Alcohol abuse Father   . Drug abuse Father   . Cancer Maternal Aunt     prostate  . Heart disease Maternal Aunt   . Alcohol abuse Maternal Grandmother   . Hyperlipidemia Maternal Grandmother   . Hypertension Maternal Grandmother   . Hyperlipidemia  Paternal Grandmother   . Hyperlipidemia Paternal Grandfather    Social History  Substance Use Topics  . Smoking status: Never Smoker   . Smokeless tobacco: None  . Alcohol Use: 0.0 oz/week    0 Standard drinks or equivalent per week     Comment: occ / holidays   OB History    No data available     Review of Systems  All other systems reviewed and are negative.     Allergies  Penicillins  Home Medications   Prior to Admission medications   Medication Sig Start Date End Date Taking? Authorizing Provider  albuterol (PROVENTIL HFA;VENTOLIN HFA) 108 (90 BASE) MCG/ACT inhaler Inhale 2 puffs into the lungs every 6 (six) hours as needed for wheezing or shortness of breath. 03/15/15  Yes Sandford Craze, NP  ferrous sulfate (FER-IN-SOL) 75 (15 FE) MG/ML SOLN Take by mouth. Unknown dose    Historical Provider, MD  metoprolol tartrate (LOPRESSOR) 25 MG tablet Take 1 tablet (25 mg total) by mouth 2 (two) times daily. 03/15/15   Sandford Craze, NP  naproxen (NAPROSYN) 500 MG tablet Take 1 tablet (500 mg total) by mouth 2 (two) times daily with a meal. As needed for pain 05/18/15   Linwood Dibbles,  MD  norgestimate-ethinyl estradiol (ORTHO-CYCLEN,SPRINTEC,PREVIFEM) 0.25-35 MG-MCG tablet Take 1 tablet by mouth daily. 03/15/15   Sandford Craze, NP  SUMAtriptan (IMITREX) 50 MG tablet Take 1 tablet (50 mg total) by mouth every 2 (two) hours as needed. May repeat in 2 hours if headache persists or recurs. (max 2 tabs/24hrs) 03/15/15   Sandford Craze, NP   BP 144/90 mmHg  Pulse 80  Temp(Src) 98.9 F (37.2 C) (Oral)  Resp 20  Ht  (1.575 m)  Wt 156.945 kg  BMI 63.27 kg/m2  SpO2 99%  LMP 03/18/2015 Physical Exam  Constitutional:  Morbidly obese  HENT:  Head: Normocephalic and atraumatic.  Right Ear: External ear normal.  Left Ear: External ear normal.  Eyes: Conjunctivae are normal. Right eye exhibits no discharge. Left eye exhibits no discharge. No scleral icterus.  Neck:  Neck supple. No tracheal deviation present.  Cardiovascular: Normal rate, regular rhythm and intact distal pulses.   Pulmonary/Chest: Effort normal and breath sounds normal. No stridor. No respiratory distress. She has no wheezes. She has no rales. She exhibits no tenderness.  Abdominal: Soft. Bowel sounds are normal. She exhibits no distension. There is no tenderness. There is no rebound and no guarding.  Musculoskeletal: She exhibits no edema or tenderness.  Neurological: She is alert. She has normal strength. No cranial nerve deficit (no facial droop, extraocular movements intact, no slurred speech) or sensory deficit. She exhibits normal muscle tone. She displays no seizure activity. Coordination normal.  Skin: Skin is warm and dry. No rash noted. She is not diaphoretic.  Psychiatric: She has a normal mood and affect.  Nursing note and vitals reviewed.   ED Course  Procedures (including critical care time) Labs Review Labs Reviewed  CBC - Abnormal; Notable for the following:    Hemoglobin 10.2 (*)    HCT 35.0 (*)    MCV 69.3 (*)    MCH 20.2 (*)    MCHC 29.1 (*)    RDW 17.0 (*)    All other components within normal limits  COMPREHENSIVE METABOLIC PANEL - Abnormal; Notable for the following:    Glucose, Bld 102 (*)    Calcium 8.6 (*)    All other components within normal limits  TROPONIN I  D-DIMER, QUANTITATIVE (NOT AT [facility])    Imaging Review Dg Chest 2 View  05/18/2015  CLINICAL DATA:  Onset of chest pain and tightness today with numbness in the left fingers; history of chronic bronchitis, asthma, and morbid obesity. Nonsmoker. EXAM: CHEST  2 VIEW COMPARISON:  PA and lateral chest x-ray of August 19, 2013 FINDINGS: The lungs remain hypoinflated. The interstitial markings are coarse and more conspicuous than on the previous study. There is no alveolar infiltrate. There is no pleural effusion or pneumothorax. The cardiac silhouette is top-normal in size. The mediastinum is normal  in width. There is no pleural effusion. There is mild multilevel degenerative disc space narrowing of the thoracic spine. IMPRESSION: Stable hypoinflation. Mild interstitial prominence bilaterally likely reflects acute bronchitic change superimposed upon chronic reactive airway disease. There is no alveolar pneumonia nor pulmonary edema. Electronically Signed   By: David  [place] M.D.   On: 05/18/2015 09:30      EKG Interpretation   Date/Time:  Tuesday May 18 2015 08:44:56 EST Ventricular Rate:  75 PR Interval:  143 QRS Duration: 100 QT Interval:  381 QTC Calculation: 425 R Axis:   29 Text Interpretation:  Sinus rhythm No significant change since last  tracing Confirmed by Slaton Reaser  MD-J, Lionell Matuszak ([id number]) on 05/18/2015 8:50:20 AM      MDM   Final diagnoses:  Chest pain, unspecified chest pain type   I reviewed the labs and x-ray findings with the patient. Patient's symptoms are atypical for acute coronary syndrome. She is low risk for heart disease. Doubt that the chest pain today is related to a cardiac etiology.  Patient is low risk for pulmonary embolism. She does take oral contraceptives. D-dimer is negative. I doubt pulmonary embolism.  Plan on discharge home with NSAIDs for symptomatic relief. Follow up with her primary doctor if the symptoms persist. Warning signs and precautions were discussed.    Linwood DibblesJon Rozelia Catapano, MD 05/18/15 [phone]

## 2015-05-18 NOTE — ED Notes (Signed)
Patient changed into a gown from waist up.  

## 2015-05-20 ENCOUNTER — Encounter: Payer: Commercial Managed Care - PPO | Admitting: Physical Therapy

## 2015-05-26 ENCOUNTER — Encounter: Payer: Self-pay | Admitting: Family

## 2015-05-26 NOTE — Telephone Encounter (Signed)
Routed to [place] T[place], just [place].

## 2015-05-27 NOTE — Addendum Note (Signed)
Addended by: Sandford Craze'SULLIVAN, Gresham Caetano on: 05/27/2015 10:01 AM   Modules accepted: Orders

## 2015-11-12 DIAGNOSIS — F329 Major depressive disorder, single episode, unspecified: Secondary | ICD-10-CM | POA: Insufficient documentation

## 2015-11-12 DIAGNOSIS — Y939 Activity, unspecified: Secondary | ICD-10-CM | POA: Insufficient documentation

## 2015-11-12 DIAGNOSIS — T161XXA Foreign body in right ear, initial encounter: Secondary | ICD-10-CM | POA: Insufficient documentation

## 2015-11-12 DIAGNOSIS — X58XXXA Exposure to other specified factors, initial encounter: Secondary | ICD-10-CM | POA: Diagnosis not present

## 2015-11-12 DIAGNOSIS — Y929 Unspecified place or not applicable: Secondary | ICD-10-CM | POA: Insufficient documentation

## 2015-11-12 DIAGNOSIS — Y999 Unspecified external cause status: Secondary | ICD-10-CM | POA: Diagnosis not present

## 2015-11-12 DIAGNOSIS — M1990 Unspecified osteoarthritis, unspecified site: Secondary | ICD-10-CM | POA: Diagnosis not present

## 2015-11-12 MED ORDER — LIDOCAINE VISCOUS 2 % MT SOLN
15.0000 mL | Freq: Once | OROMUCOSAL | Status: AC
  Administered 2015-11-12: 15 mL via OROMUCOSAL
  Filled 2015-11-12: qty 15

## 2015-11-13 ENCOUNTER — Encounter (HOSPITAL_BASED_OUTPATIENT_CLINIC_OR_DEPARTMENT_OTHER): Payer: Self-pay | Admitting: *Deleted

## 2015-11-13 ENCOUNTER — Emergency Department (HOSPITAL_BASED_OUTPATIENT_CLINIC_OR_DEPARTMENT_OTHER)
Admission: EM | Admit: 2015-11-13 | Discharge: 2015-11-13 | Disposition: A | Discharge status: Home / Self Care | Payer: Commercial Managed Care - PPO | Attending: Emergency Medicine | Admitting: Emergency Medicine

## 2015-11-13 DIAGNOSIS — T161XXA Foreign body in right ear, initial encounter: Secondary | ICD-10-CM

## 2015-11-13 MED ORDER — DIPHENHYDRAMINE HCL 25 MG PO CAPS
50.0000 mg | ORAL_CAPSULE | Freq: Once | ORAL | Status: AC
  Administered 2015-11-13: 50 mg via ORAL
  Filled 2015-11-13: qty 2

## 2015-11-13 NOTE — ED Notes (Signed)
Bug flew in rt ear,  But was removed in triage,  C/o burning to rt side of face

## 2015-11-13 NOTE — ED Notes (Signed)
Bug is out of Pt. Ear. The whole bug crawled out of pt. Ear.

## 2015-11-13 NOTE — Discharge Instructions (Signed)
Take Benadryl, 25-50 mg every 6 hours. Ibuprofen or Tylenol for pain. Follow-up with her doctor or return to emergency department as needed.   Ear Foreign Body An ear foreign body is an object that is stuck in your ear. The object is usually stuck in the ear canal. CAUSES In all ages of people, the most common foreign bodies are insects that enter the ear canal. It is common for young children to put objects into the ear canal. These may include pebbles, beads, parts of toys, and any other small objects that fit into the ear. In adults, objects such as cotton swabs may become lodged in the ear canal.  SIGNS AND SYMPTOMS A foreign body in the ear may cause:  Pain.  Buzzing or roaring sounds.  Hearing loss.  Ear drainage or bleeding.  Nausea and vomiting.  A feeling that your ear is full. DIAGNOSIS Your health care provider may be able to diagnose an ear foreign body based on the information that you provide, your symptoms, and a physical exam. Your health care provider may also perform tests, such as testing your hearing and your ear pressure, to check for infection or other problems that are caused by the foreign body in your ear. TREATMENT Treatment depends on what the foreign body is, the location of the foreign body in your ear, and whether or not the foreign body has injured any part of your inner ear. If the foreign body is visible to your health care provider, it may be possible to remove the foreign body using:  A tool, such as medical tweezers (forceps) or a suction tube (catheter).  Irrigation. This uses water to flush the foreign body out of your ear. This is used only if the foreign body is not likely to swell or enlarge when it is put in water. If the foreign body is not visible or your health care provider was not able to remove the foreign body, you may be referred to a specialist for removal. You may also be prescribed antibiotic medicine or ear drops to prevent infection.  If the foreign body has caused injury to other parts of your ear, you may need additional treatment. HOME CARE INSTRUCTIONS  Keep all follow-up visits as directed by your health care provider. This is important.  Take medicines only as directed by your health care provider.  If you were prescribed an antibiotic medicine, finish it all even if you start to feel better. PREVENTION  Keep small objects out of reach of young children. Tell children not to put anything in their ears.  Do not put anything in your ear, including cotton swabs, to clean your ears. Talk to your health care provider about how to clean your ears safely. SEEK MEDICAL CARE IF:  You have a headache.  Your have blood coming from your ear.  You have a fever.  You have increased pain or swelling of your ear.  Your hearing is reduced.  You have discharge coming from your ear.   This information is not intended to replace advice given to you by your health care provider. Make sure you discuss any questions you have with your health care provider.   Document Released: 05/12/2000 Document Revised: 06/05/2014 Document Reviewed: 12/29/2013 Elsevier Interactive Patient Education [employer]2016 Elsevier Inc.

## 2015-11-13 NOTE — ED Provider Notes (Signed)
CSN: [id number]     Arrival date & time 11/12/15  2344 History   First MD Initiated Contact with Patient 11/13/15 0013     Chief Complaint  Patient presents with  . Foreign Body in Ear     (Consider location/radiation/quality/duration/timing/severity/associated sxs/prior Treatment) HPI Sheila Donovan is a 25 y.o. female presents to emergency department complaining of acute onset of sensation of the insect in her right ear. Patient believes this insect stung her. She states she suddenly began having pain and buzzing in her right ear and sensation of something moving. She states pain radiated across entire right face. Patient is tearful in triage, states is painful. No tx prior to coming in.   Past Medical History  Diagnosis Date  . Chronic bronchitis (HCC)   . Anemia   . Depression   . Anxiety   . History of ear infections   . History of chicken pox   . Arthritis   . Migraines   . Morbid obesity [facility](HCC)    Past Surgical History  Procedure Laterality Date  . Tonsillectomy    . Adenoidectomy    . Tympanostomy tube placement    . Cholecystectomy N/A 06/09/2014    Procedure: LAPAROSCOPIC CHOLECYSTECTOMY ;  Surgeon: Emelia LoronMatthew Wakefield, MD;  [facility];  Service: General;  Laterality: N/A;   Family History  Problem Relation Age of Onset  . Diabetes Mother   . Hyperlipidemia Mother   . Hypertension Mother   . Depression Mother   . Alcohol abuse Mother   . Drug abuse Mother   . Heart attack Neg Hx   . Sudden death Neg Hx   . Depression Father   . Alcohol abuse Father   . Drug abuse Father   . Cancer Maternal Aunt     prostate  . Heart disease Maternal Aunt   . Alcohol abuse Maternal Grandmother   . Hyperlipidemia Maternal Grandmother   . Hypertension Maternal Grandmother   . Hyperlipidemia Paternal Grandmother   . Hyperlipidemia Paternal Grandfather    Social History  Substance Use Topics  . Smoking status: Never Smoker   . Smokeless tobacco: None  . Alcohol Use:  0.0 oz/week    0 Standard drinks or equivalent per week     Comment: occ / holidays   OB History    No data available     Review of Systems  Constitutional: Negative for fever and chills.  HENT: Positive for ear pain.   Musculoskeletal: Negative for myalgias, arthralgias, neck pain and neck stiffness.  Skin: Negative for rash.  Neurological: Negative for dizziness, weakness and headaches.  All other systems reviewed and are negative.     Allergies  Penicillins  Home Medications   Prior to Admission medications   Medication Sig Start Date End Date Taking? Authorizing Provider  omeprazole (PRILOSEC) 20 MG capsule Take 20 mg by mouth daily.   Yes Historical Provider, MD  albuterol (PROVENTIL HFA;VENTOLIN HFA) 108 (90 BASE) MCG/ACT inhaler Inhale 2 puffs into the lungs every 6 (six) hours as needed for wheezing or shortness of breath. 03/15/15   Sandford CrazeMelissa O'Sullivan, NP  ferrous sulfate (FER-IN-SOL) 75 (15 FE) MG/ML SOLN Take by mouth. Unknown dose    Historical Provider, MD  metoprolol tartrate (LOPRESSOR) 25 MG tablet Take 1 tablet (25 mg total) by mouth 2 (two) times daily. 03/15/15   Sandford CrazeMelissa O'Sullivan, NP  naproxen (NAPROSYN) 500 MG tablet Take 1 tablet (500 mg total) by mouth 2 (two) times daily with a meal.  As needed for pain 05/18/15   Linwood Dibbles, MD  norgestimate-ethinyl estradiol (ORTHO-CYCLEN,SPRINTEC,PREVIFEM) 0.25-35 MG-MCG tablet Take 1 tablet by mouth daily. 03/15/15   Sandford Craze, NP  SUMAtriptan (IMITREX) 50 MG tablet Take 1 tablet (50 mg total) by mouth every 2 (two) hours as needed. May repeat in 2 hours if headache persists or recurs. (max 2 tabs/24hrs) 03/15/15   Sandford Craze, NP   LMP 10/30/2015 Physical Exam  Constitutional: She appears well-developed and well-nourished. No distress.  HENT:  Mouth/Throat: Oropharynx is clear and moist.  Normal external ear and face. Ear canal erythematous. No swelling or edema. TM is intact.  Eyes: Conjunctivae are  normal.  Neck: Neck supple.  Neurological: She is alert.  Skin: Skin is warm and dry.  Nursing note and vitals reviewed.   ED Course  Procedures (including critical care time) Labs Review Labs Reviewed - No data to display  Imaging Review No results found. I have personally reviewed and evaluated these images and lab results as part of my medical decision-making.   EKG Interpretation None      MDM   Final diagnoses:  Foreign body in right ear, initial encounter   Patients with a insect in right ear. I instructed triage nurse to apply viscous lidocaine in the right ear, patient is very agitated, screaming, crying. Upon application of lidocaine, the insect crawled out on its own. Ear exam showed some erythema in the ear canal, otherwise normal ear. TM is intact. No swelling of the external ear, lips, oropharynx. Patient denies difficulty breathing. Will give Benadryl. Discharged home with Benadryl, follow up as needed.  Filed Vitals:   11/13/15 0055  BP: 139/72  Pulse: 86  Temp: 98.1 F (36.7 C)  TempSrc: Oral  Resp: 18  SpO2: 100%       Jaynie Crumble, PA-C 11/13/15 0114  April Palumbo, MD 11/13/15 0122

## 2015-11-13 NOTE — ED Notes (Signed)
Pt. Immediately tended to in triage after RN seeing bug in Pt. Ear.  RN placed lidocaine into the R ear per EDP order Pt. Tolerated well.  Pt. Calmed down in triage enough to talk and be completely triaged.

## 2015-11-13 NOTE — ED Notes (Signed)
Pt. Reports she was leaving from work and felt something go into her R ear.  Upon assessment Pt. Has a foreign object in her ear some type of flying bug.

## 2015-11-16 ENCOUNTER — Encounter: Payer: Self-pay | Admitting: Family

## 2015-11-16 ENCOUNTER — Ambulatory Visit (INDEPENDENT_AMBULATORY_CARE_PROVIDER_SITE_OTHER): Payer: Commercial Managed Care - PPO | Admitting: Family

## 2015-11-16 ENCOUNTER — Encounter: Payer: Self-pay | Admitting: Gastroenterology

## 2015-11-16 VITALS — BP 142/88 | HR 72 | Temp 98.4°F | Resp 20 | Ht 61.0 in | Wt 351.0 lb

## 2015-11-16 DIAGNOSIS — K529 Noninfective gastroenteritis and colitis, unspecified: Secondary | ICD-10-CM | POA: Diagnosis not present

## 2015-11-16 DIAGNOSIS — M5440 Lumbago with sciatica, unspecified side: Secondary | ICD-10-CM

## 2015-11-16 MED ORDER — MELOXICAM 7.5 MG PO TABS: 7.5000 mg | ORAL_TABLET | Freq: Every day | ORAL | Status: DC

## 2015-11-16 MED ORDER — SUMATRIPTAN SUCCINATE 50 MG PO TABS: 50.0000 mg | ORAL_TABLET | ORAL | Status: AC | PRN

## 2015-11-16 MED ORDER — OMEPRAZOLE 20 MG PO CPDR: 20.0000 mg | DELAYED_RELEASE_CAPSULE | Freq: Every day | ORAL | Status: AC

## 2015-11-16 NOTE — Progress Notes (Signed)
Subjective:    Patient ID: Sheila Donovan, female    DOB: 28-Sep-1990, 25 y.o.   MRN: [id number]  HPI  Sheila Donovan is a 25 yr old female who presents today with chief complaint of back pain.  She reports that pain has been present x 1 month and is located in the lower back. Occasionally will radiate down the leg.  Has tried ibuprofen a few times, heat and cold without improvement.  Denies bowel/bladder incontinence.   Notes chronic diarrhea- which has worsened since her cholecystectomy.   BP Readings from Last 3 Encounters:  11/16/15 150/96  11/13/15 139/72  05/18/15 130/79     Review of Systems    see HPI  Past Medical History  Diagnosis Date  . Chronic bronchitis (HCC)   . Anemia   . Depression   . Anxiety   . History of ear infections   . History of chicken pox   . Arthritis   . Migraines   . Morbid obesity (HCC)      Social History   Social History  . Marital Status: Single    Spouse Name: N/A  . Number of Children: N/A  . Years of Education: N/A   Occupational History  . Not on file.   Social History Main Topics  . Smoking status: Never Smoker   . Smokeless tobacco: Not on file  . Alcohol Use: 0.0 oz/week    0 Standard drinks or equivalent per week     Comment: occ / holidays  . Drug Use: No  . Sexual Activity: Not on file   Other Topics Concern  . 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        Past Surgical History  Procedure Laterality Date  . Tonsillectomy    . Adenoidectomy    . Tympanostomy tube placement    . Cholecystectomy N/A 06/09/2014    Procedure: LAPAROSCOPIC CHOLECYSTECTOMY ;  Surgeon: Emelia LoronMatthew Wakefield, MD;  [facility];  Service: General;  Laterality: N/A;    Family History  Problem Relation Age of Onset  . Diabetes Mother   . Hyperlipidemia Mother   . Hypertension Mother   . Depression  Mother   . Alcohol abuse Mother   . Drug abuse Mother   . Heart attack Neg Hx   . Sudden death Neg Hx   . Depression Father   . Alcohol abuse Father   . Drug abuse Father   . Cancer Maternal Aunt     prostate  . Heart disease Maternal Aunt   . Alcohol abuse Maternal Grandmother   . Hyperlipidemia Maternal Grandmother   . Hypertension Maternal Grandmother   . Hyperlipidemia Paternal Grandmother   . Hyperlipidemia Paternal Grandfather     Allergies  Allergen Reactions  . Norgestimate-Eth Estradiol     Weight gain  . Penicillins Hives    Current Outpatient Prescriptions on File Prior to Visit  Medication Sig Dispense Refill  . albuterol (PROVENTIL HFA;VENTOLIN HFA) 108 (90 BASE) MCG/ACT inhaler Inhale 2 puffs into the lungs every 6 (six) hours as needed for wheezing or shortness of breath. 1 Inhaler 2  . ferrous sulfate (FER-IN-SOL) 75 (15 FE) MG/ML SOLN Take by mouth. Unknown dose    . omeprazole (PRILOSEC) 20 MG capsule Take 20 mg by mouth daily. Reported on 11/16/2015    .  SUMAtriptan (IMITREX) 50 MG tablet Take 1 tablet (50 mg total) by mouth every 2 (two) hours as needed. May repeat in 2 hours if headache persists or recurs. (max 2 tabs/24hrs) (Patient not taking: Reported on 11/16/2015) 10 tablet 3   No current facility-administered medications on file prior to visit.    BP 150/96 mmHg  Pulse 72  Temp(Src) 98.4 F (36.9 C) (Oral)  Resp 20  Ht  (1.549 m)  Wt 351 lb (159.213 kg)  BMI 66.36 kg/m2  SpO2 100%  LMP 10/30/2015    Objective:   Physical Exam  Constitutional: She is oriented to person, place, and time. She appears well-developed and well-nourished.  HENT:  Head: Normocephalic and atraumatic.  Cardiovascular: Normal rate, regular rhythm and normal heart sounds.   No murmur heard. Pulmonary/Chest: Effort normal and breath sounds normal. No respiratory distress. She has no wheezes.  Musculoskeletal:  + straight leg raise on left  Neurological: She  is alert and oriented to person, place, and time.  Reflex Scores:      Patellar reflexes are 2+ on the right side and 2+ on the left side. Psychiatric: She has a normal mood and affect. Her behavior is normal. Judgment and thought content normal.          Assessment & Plan:  Low back pain- rx with meloxicam, refer to PT. Discussed red flags (bowel/bladder incontinence). Pt is advised to go to the ED if this occurs.  Chronic diarrhea- refer to GI for further evaluation.

## 2015-11-16 NOTE — Patient Instructions (Signed)
Please begin meloxicam once daily for next 2 weeks. You will be contacted about your referral to GI and to physical therapy.  Follow up in 1 month.

## 2015-11-16 NOTE — Progress Notes (Signed)
Pre visit review using our clinic review tool, if applicable. No additional management support is needed unless otherwise documented below in the visit note. 

## 2015-12-09 ENCOUNTER — Telehealth: Payer: Self-pay

## 2015-12-09 NOTE — Telephone Encounter (Signed)
Received form via fax from Sabrina from [facility] requesting medical clearance for bariatric surgery.  Pt was last seen on 11/16/15 for an acute visit.  She has not had physical.  Pt needs an appt scheduled.  Called patient and left a message on vm for call back.

## 2015-12-16 ENCOUNTER — Telehealth: Payer: Self-pay | Admitting: Family

## 2015-12-16 ENCOUNTER — Ambulatory Visit (INDEPENDENT_AMBULATORY_CARE_PROVIDER_SITE_OTHER): Payer: Commercial Managed Care - PPO | Admitting: Family

## 2015-12-16 ENCOUNTER — Encounter: Payer: Self-pay | Admitting: Family

## 2015-12-16 NOTE — Patient Instructions (Signed)
Please contact your insurance to check if they cover physical therapy and find out who some local PT providers are. Then call us with this info and we will arrange PT visit.  Continue your work with diet, exercise and weight loss.

## 2015-12-16 NOTE — Telephone Encounter (Signed)
Letters faxed to [facility] at [phone].

## 2015-12-16 NOTE — Assessment & Plan Note (Signed)
Current BMP is 66.9.  Pt counseled on diet/exercise and weight loss. Forms filled for her insurance company in support of bariatric surgery. 15 minutes spent with pt.  >50% of this time was spent counseling patient.

## 2015-12-16 NOTE — Progress Notes (Signed)
Subjective:    Patient ID: Sheila Donovan, female    DOB: August 25, 1990, 25 y.o.   MRN: [id number]  HPI   Sheila Donovan is a 25 yr old female who presents today to discuss Morbid Obesity and weight loss. She is followed by the Bariatric Clinic at [facility] and is working on Therapist, occupational for bariatric surgery.  She has been following with the nutritionist at the Bariatric Clinic monthly.  She reports no significant weight loss.  (weight has been "up and down."    Reports the following diet:  Breakfast (protein shake) Lowfat yogurt or cheese   Protein shake for lunch Piece of fruit  3 oz chicken for dinner Lettuce/cheese/lowfat dressing Broccoli or green beans  Reports that she is on her feet 8 hours a day at work.  Reports that 2 times a day she walks the dogs for 30 minutes.  Also does some exercise videos   Wt Readings from Last 3 Encounters:  12/16/15 354 lb (160.573 kg)  11/16/15 351 lb (159.213 kg)  05/18/15 346 lb (156.945 kg)   Reports that back pain is unchanged and was told that they could not find a PT who takes her insurance.   Review of Systems See HPI  Past Medical History  Diagnosis Date  . Chronic bronchitis (HCC)   . Anemia   . Depression   . Anxiety   . History of ear infections   . History of chicken pox   . Arthritis   . Migraines   . Morbid obesity (HCC)      Social History   Social History  . Marital Status: Single    Spouse Name: N/A  . Number of Children: N/A  . Years of Education: N/A   Occupational History  . Not on file.   Social History Main Topics  . Smoking status: Never Smoker   . Smokeless tobacco: Not on file  . Alcohol Use: 0.0 oz/week    0 Standard drinks or equivalent per week     Comment: occ / holidays  . Drug Use: No  . Sexual Activity: Not on file   Other Topics Concern  . 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        Past Surgical History  Procedure Laterality Date  . Tonsillectomy    . Adenoidectomy    . Tympanostomy tube placement    . Cholecystectomy N/A 06/09/2014    Procedure: LAPAROSCOPIC CHOLECYSTECTOMY ;  Surgeon: Emelia Loron, MD;  Location: [facility] OR;  Service: General;  Laterality: N/A;    Family History  Problem Relation Age of Onset  . Diabetes Mother   . Hyperlipidemia Mother   . Hypertension Mother   . Depression Mother   . Alcohol abuse Mother   . Drug abuse Mother   . Heart attack Neg Hx   . Sudden death Neg Hx   . Depression Father   . Alcohol abuse Father   . Drug abuse Father   . Cancer Maternal Aunt     prostate  . Heart disease Maternal Aunt   . Alcohol abuse Maternal Grandmother   . Hyperlipidemia Maternal Grandmother   . Hypertension Maternal Grandmother   . Hyperlipidemia Paternal Grandmother   . Hyperlipidemia Paternal Grandfather     Allergies  Allergen Reactions  . Norgestimate-Eth  Estradiol     Weight gain  . Penicillins Hives    Current Outpatient Prescriptions on File Prior to Visit  Medication Sig Dispense Refill  . albuterol (PROVENTIL HFA;VENTOLIN HFA) 108 (90 BASE) MCG/ACT inhaler Inhale 2 puffs into the lungs every 6 (six) hours as needed for wheezing or shortness of breath. 1 Inhaler 2  . ferrous sulfate (FER-IN-SOL) 75 (15 FE) MG/ML SOLN Take by mouth. Unknown dose    . omeprazole (PRILOSEC) 20 MG capsule Take 1 capsule (20 mg total) by mouth daily. Reported on 11/16/2015 30 capsule 5  . SUMAtriptan (IMITREX) 50 MG tablet Take 1 tablet (50 mg total) by mouth every 2 (two) hours as needed. May repeat in 2 hours if headache persists or recurs. (max 2 tabs/24hrs) 10 tablet 5   No current facility-administered medications on file prior to visit.    BP 134/84 mmHg  Pulse 86  Temp(Src) 98.1 F (36.7 C) (Oral)  Resp 18  Ht 5\' 1"  (1.549 m)  Wt 354 lb (160.573 kg)  BMI 66.92 kg/m2  SpO2 98%   LMP 12/11/2015       Objective:   Physical Exam  Constitutional: She is oriented to person, place, and time. She appears well-developed and well-nourished.  Cardiovascular: Normal rate, regular rhythm and normal heart sounds.   No murmur heard. Pulmonary/Chest: Effort normal and breath sounds normal. No respiratory distress. She has no wheezes.  Neurological: She is alert and oriented to person, place, and time.  Psychiatric: She has a normal mood and affect. Her behavior is normal. Judgment and thought content normal.          Assessment & Plan:

## 2015-12-16 NOTE — Telephone Encounter (Signed)
Spoke with patient re: letter for bariatric reference.  She tells me that she has been meeting with the nutritionist at the bariatric clinic once a month for 6 months and has been unsuccessful at losing weight.  Previous diets include weight watchers, herbal life, atkins without significant weight reduction.

## 2015-12-16 NOTE — Progress Notes (Signed)
Pre visit review using our clinic review tool, if applicable. No additional management support is needed unless otherwise documented below in the visit note. 

## 2015-12-23 NOTE — Telephone Encounter (Signed)
Pt came in for an appt on 12/16/15.

## 2015-12-28 ENCOUNTER — Telehealth: Payer: Self-pay | Admitting: *Deleted

## 2015-12-28 NOTE — Telephone Encounter (Signed)
Received completed and signed form from [place].  All forms faxed to [facility] at [phone]).  Confirmation received.//AB/CMA   Received Weight History Form via fax from [facility].  Form filled out as much as possible and given to [facility] to review/signature.//AB/CMA

## 2016-01-03 ENCOUNTER — Ambulatory Visit (INDEPENDENT_AMBULATORY_CARE_PROVIDER_SITE_OTHER): Payer: Commercial Managed Care - PPO | Admitting: Gastroenterology

## 2016-01-03 ENCOUNTER — Other Ambulatory Visit (INDEPENDENT_AMBULATORY_CARE_PROVIDER_SITE_OTHER): Payer: Commercial Managed Care - PPO

## 2016-01-03 ENCOUNTER — Encounter: Payer: Self-pay | Admitting: Gastroenterology

## 2016-01-03 VITALS — BP 152/84 | HR 90 | Ht 62.0 in | Wt 349.0 lb

## 2016-01-03 DIAGNOSIS — K580 Irritable bowel syndrome with diarrhea: Secondary | ICD-10-CM

## 2016-01-03 DIAGNOSIS — R932 Abnormal findings on diagnostic imaging of liver and biliary tract: Secondary | ICD-10-CM | POA: Diagnosis not present

## 2016-01-03 LAB — HEPATIC FUNCTION PANEL
ALBUMIN: 3.9 g/dL (ref 3.5–5.2)
ALT: 20 U/L (ref 0–35)
AST: 10 U/L (ref 0–37)
Alkaline Phosphatase: 69 U/L (ref 39–117)
BILIRUBIN TOTAL: 0.5 mg/dL (ref 0.2–1.2)
Bilirubin, Direct: 0.1 mg/dL (ref 0.0–0.3)
TOTAL PROTEIN: 7 g/dL (ref 6.0–8.3)

## 2016-01-03 MED ORDER — COLESTIPOL HCL 1 G PO TABS: 1.0000 g | ORAL_TABLET | Freq: Two times a day (BID) | ORAL | 2 refills | Status: AC

## 2016-01-03 NOTE — Patient Instructions (Signed)
Your physician has requested that you go to the basement for the following lab work before leaving today:  Hepatic function, fecal lactoferrin  You have been scheduled for an abdominal ultrasound at [facility] (1st floor of hospital) on Monday, 01/10/16 at 9:30 am. Please arrive 15 minutes prior to your appointment for registration. Make certain not to have anything to eat or drink 6 hours prior to your appointment. Should you need to reschedule your appointment, please contact radiology at [phone]. This test typically takes about 30 minutes to perform.  We have sent the following medications to your pharmacy for you to pick up at your convenience: colestid 1 gram twice daily  Please follow a low FODMAP diet. This was given to you today.  If you are age 27 or older, your body mass index should be between 23-30. Your Body mass index is 63.83 kg/m. If this is out of the aforementioned range listed, please consider follow up with your Primary Care Provider.  If you are age 27 or younger, your body mass index should be between 19-25. Your Body mass index is 63.83 kg/m. If this is out of the aformentioned range listed, please consider follow up with your Primary Care Provider.

## 2016-01-03 NOTE — Progress Notes (Signed)
HPI :  25 y/o female with a history of morbid obesity, anemia secondary to menorrhagia, and depression, here for a new patient visit for diarrhea.   Patient reports symptoms ongoing for at least 7 years. She has roughly 5 BMs per day at least, max upwards of 12 but this is rare. She reports shortly after she eats or drinks anything she needs to have a bowel movements. Most bowel movements are loose and watery. She has a strong urge to have a bowel movement. She has had fecal incontinence in the past but every rare. No blood noted in the stools. She has tried immodium in the past and it did not help too much. No routine nocturnal symptoms.   She has had her gallbladder removed in 2015 which has made her diarrhea worse. She has some pains in her back when she needs to have a bowel movement. When she has a bowel movement it takes her pain away. She denies much pains in the abdomen. She has some bloating and gas that bothers her at times. She has tried cutting back on fried foods. She reports she has had a prior upper endoscopy done by a surgeon prior to possible bariatric surgery, hiatal hernia noted. She takes prilosec 20mg  daily which controls her heartburn. Sometimes depending on what she eats it can cause worsening heartburn.   She is going through the process for bariatric surgery for her obesity. She is moving to [place] but not sure when this will be done.  After her gallbladder was removed it took away her pain which bothered her in the epigastric area. No further episodes of upper abdominal pains.  No FH of colon cancer. No celiac in the family, no IBD known in the family.   She reports she has heavy menstrual periods, lasting over 2 months at a time and causing iron deficiency. Sometimes months she won't have menses and other times her cycle last a few months. She is taking iron for this. She reports cervical / uterine prolapse. She has been seen by GYN and awaiting repair following gastric  sleeve surgery.   Of note, on review of prior imaging she had an US of the abdomen in Jan 2016 showing "coarse echotexture of hepatic parenchyma is noted suggesting diffuse hepatocellular disease. Mild splenomegaly is noted as well". She denies any history of liver disease. ALT has been normal remotely.   Past Medical History:  Diagnosis Date  . Anemia   . Anxiety   . Arthritis   . Chronic bronchitis (HCC)   . Depression   . History of chicken pox   . History of ear infections   . Migraines   . Morbid obesity (HCC)      Past Surgical History:  Procedure Laterality Date  . ADENOIDECTOMY    . CHOLECYSTECTOMY N/A 06/09/2014   Procedure: LAPAROSCOPIC CHOLECYSTECTOMY ;  Surgeon: Emelia Loron, MD;  Location: [facility] OR;  Service: General;  Laterality: N/A;  . TONSILLECTOMY    . TYMPANOSTOMY TUBE PLACEMENT     Family History  Problem Relation Age of Onset  . Diabetes Mother   . Hyperlipidemia Mother   . Hypertension Mother   . Depression Mother   . Alcohol abuse Mother   . Drug abuse Mother   . Depression Father   . Alcohol abuse Father   . Drug abuse Father   . Cancer Maternal Aunt     prostate  . Heart disease Maternal Aunt   . Alcohol abuse Maternal  Grandmother   . Hyperlipidemia Maternal Grandmother   . Hypertension Maternal Grandmother   . Hyperlipidemia Paternal Grandmother   . Hyperlipidemia Paternal Grandfather   . Heart attack Neg Hx   . Sudden death Neg Hx    Social History  Substance Use Topics  . Smoking status: Never Smoker  . Smokeless tobacco: Never Used  . Alcohol use 0.0 oz/week     Comment: occ / holidays   Current Outpatient Prescriptions  Medication Sig Dispense Refill  . albuterol (PROVENTIL HFA;VENTOLIN HFA) 108 (90 BASE) MCG/ACT inhaler Inhale 2 puffs into the lungs every 6 (six) hours as needed for wheezing or shortness of breath. 1 Inhaler 2  . ferrous sulfate (FER-IN-SOL) 75 (15 FE) MG/ML SOLN Take by mouth. Unknown dose    . omeprazole  (PRILOSEC) 20 MG capsule Take 1 capsule (20 mg total) by mouth daily. Reported on 11/16/2015 30 capsule 5  . SUMAtriptan (IMITREX) 50 MG tablet Take 1 tablet (50 mg total) by mouth every 2 (two) hours as needed. May repeat in 2 hours if headache persists or recurs. (max 2 tabs/24hrs) 10 tablet 5   No current facility-administered medications for this visit.    Allergies  Allergen Reactions  . Norgestimate-Eth Estradiol     Weight gain  . Penicillins Hives     Review of Systems: All systems reviewed and negative except where noted in HPI.   Lab Results  Component Value Date   WBC 7.0 05/18/2015   HGB 10.2 (L) 05/18/2015   HCT 35.0 (L) 05/18/2015   MCV 69.3 (L) 05/18/2015   PLT 218 05/18/2015   Lab Results  Component Value Date   CREATININE 0.61 05/18/2015   BUN 13 05/18/2015   NA 139 05/18/2015   K 3.8 05/18/2015   CL 108 05/18/2015   CO2 25 05/18/2015       Physical Exam: BP (!) 152/84 (BP Location: Right Wrist)   Pulse 90   Ht 5\' 2"  (1.575 m)   Wt (!) 349 lb (158.3 kg)   LMP 12/11/2015   BMI 63.83 kg/m  Constitutional: Pleasant,well-developed, obese female in no acute distress. HEENT: Normocephalic and atraumatic. Conjunctivae are normal. No scleral icterus. Neck supple.  Cardiovascular: Normal rate, regular rhythm.  Pulmonary/chest: Effort normal and breath sounds normal. No wheezing, rales or rhonchi. Abdominal: Soft, protuberant, nontender. Bowel sounds active throughout. There are no masses palpable.  Extremities: no edema Lymphadenopathy: No cervical adenopathy noted. Neurological: Alert and oriented to person place and time. Skin: Skin is warm and dry. No rashes noted. Psychiatric: Normal mood and affect. Behavior is normal.   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 I discussed options with her. She has some higher  frequency than usual with IBS, I doubt IBD but will send fecal calprotectin to ensure normal. I think celiac is unlikely given her weight. I counseled her on low FODMOP diet and will try her on a course of colestid. We will await this result, if no improvement I asked her to contact me for reassessment.   Abnormal liver imaging - suspect she may have steatosis but prior imaging in 2016 was concerning, although her ALT has been normal remotely. Will repeat LFTs and recommend repeat US abdomen to assess the liver and spleen given prior findings.   Morbid obesity - pending bariatric surgery evaluation which I agree with. If she is found  to have steatosis of the liver, this will help considerably.   Ileene Patrick, MD [facility] Pager [phone]  CC: Sandford Craze, NP

## 2016-01-08 ENCOUNTER — Emergency Department (HOSPITAL_BASED_OUTPATIENT_CLINIC_OR_DEPARTMENT_OTHER)
Admission: EM | Admit: 2016-01-08 | Discharge: 2016-01-08 | Disposition: A | Discharge status: Home / Self Care | Payer: Commercial Managed Care - PPO | Attending: Emergency Medicine | Admitting: Emergency Medicine

## 2016-01-08 ENCOUNTER — Encounter (HOSPITAL_BASED_OUTPATIENT_CLINIC_OR_DEPARTMENT_OTHER): Payer: Self-pay | Admitting: *Deleted

## 2016-01-08 DIAGNOSIS — J209 Acute bronchitis, unspecified: Secondary | ICD-10-CM | POA: Diagnosis not present

## 2016-01-08 DIAGNOSIS — R05 Cough: Secondary | ICD-10-CM | POA: Diagnosis present

## 2016-01-08 MED ORDER — AEROCHAMBER PLUS W/MASK MISC
1.0000 | Freq: Once | Status: AC
  Administered 2016-01-08: 1
  Filled 2016-01-08: qty 1

## 2016-01-08 MED ORDER — IPRATROPIUM-ALBUTEROL 0.5-2.5 (3) MG/3ML IN SOLN
3.0000 mL | Freq: Once | RESPIRATORY_TRACT | Status: AC
  Administered 2016-01-08: 3 mL via RESPIRATORY_TRACT
  Filled 2016-01-08: qty 3

## 2016-01-08 MED ORDER — HYDROCOD POLST-CPM POLST ER 10-8 MG/5ML PO SUER: 5.0000 mL | Freq: Two times a day (BID) | ORAL | 0 refills | Status: AC | PRN

## 2016-01-08 MED ORDER — HYDROCOD POLST-CPM POLST ER 10-8 MG/5ML PO SUER
5.0000 mL | Freq: Once | ORAL | Status: AC
  Administered 2016-01-08: 5 mL via ORAL
  Filled 2016-01-08: qty 5

## 2016-01-08 MED ORDER — AZITHROMYCIN 250 MG PO TABS: ORAL_TABLET | ORAL | 0 refills | Status: AC

## 2016-01-08 MED ORDER — ALBUTEROL SULFATE (2.5 MG/3ML) 0.083% IN NEBU
2.5000 mg | INHALATION_SOLUTION | Freq: Once | RESPIRATORY_TRACT | Status: AC
  Administered 2016-01-08: 2.5 mg via RESPIRATORY_TRACT
  Filled 2016-01-08: qty 3

## 2016-01-08 NOTE — ED Provider Notes (Signed)
[facility] Provider Note   CSN: [id number] Arrival date & time: 01/08/16  1027  First Provider Contact:  None       History   Chief Complaint Chief Complaint  Patient presents with  . Cough    HPI Sheila Donovan is a 25 y.o. female with a history of morbid obesity and asthma. She is here with a three-day history of cough. She initially had a fever but that has resolved. The cough has been nonproductive. It worsened this morning and is now severe and persistent. She denies wheezing presently. She used her inhaler yesterday but not this morning. She has had some nasal congestion with this. She has chronic diarrhea and was recently seen by gastroenterologist for this.  HPI  Past Medical History:  Diagnosis Date  . Anemia   . Anxiety   . Arthritis   . Chronic bronchitis (HCC)   . Depression   . History of chicken pox   . History of ear infections   . Migraines   . Morbid obesity [facility])     Patient Active Problem List   Diagnosis Date Noted  . Left knee pain 04/06/2015  . Injury of left hip 04/06/2015  . B12 deficiency 03/18/2015  . Iron deficiency 03/18/2015  . Asthma 03/15/2015  . 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 Migraines 03/15/2015  . Extreme obesity (HCC) 06/10/2014  . Anemia   . Bipolar depression (HCC)     Past Surgical History:  Procedure Laterality Date  . ADENOIDECTOMY    . CHOLECYSTECTOMY N/A 06/09/2014   Procedure: LAPAROSCOPIC CHOLECYSTECTOMY ;  Surgeon: Emelia Loron, MD;  Location: [facility] OR;  Service: General;  Laterality: N/A;  . TONSILLECTOMY    . TYMPANOSTOMY TUBE PLACEMENT      OB History    No data available       Home Medications    Prior to Admission medications   Medication Sig Start Date End Date Taking? Authorizing Provider  albuterol (PROVENTIL HFA;VENTOLIN HFA) 108 (90 BASE) MCG/ACT inhaler Inhale 2 puffs into the lungs every 6 (six) hours as needed for wheezing or shortness of breath. 03/15/15   Sandford Craze, NP  colestipol (COLESTID) 1 g tablet Take 1 tablet (1 g total) by mouth 2 (two) times daily. 01/03/16   Ruffin Frederick, MD  ferrous sulfate (FER-IN-SOL) 75 (15 FE) MG/ML SOLN Take by mouth. Unknown dose    Historical Provider, MD  omeprazole (PRILOSEC) 20 MG capsule Take 1 capsule (20 mg total) by mouth daily. Reported on 11/16/2015 11/16/15   Sandford Craze, NP  SUMAtriptan (IMITREX) 50 MG tablet Take 1 tablet (50 mg total) by mouth every 2 (two) hours as needed. May repeat in 2 hours if headache persists or recurs. (max 2 tabs/24hrs) 11/16/15   Sandford Craze, NP    Family History Family History  Problem Relation Age of Onset  . Diabetes Mother   . Hyperlipidemia Mother   . Hypertension Mother   . Depression Mother   . Alcohol abuse Mother   . Drug abuse Mother   . Depression Father   . Alcohol abuse Father   . Drug abuse Father   . Cancer Maternal Aunt     prostate  . Heart disease Maternal Aunt   . Alcohol abuse Maternal Grandmother   . Hyperlipidemia Maternal Grandmother   . Hypertension Maternal Grandmother   . Hyperlipidemia Paternal Grandmother   . Hyperlipidemia Paternal Grandfather   . Heart attack Neg Hx   . Sudden death  Neg Hx     Social History Social History  Substance Use Topics  . Smoking status: Never Smoker  . Smokeless tobacco: Never Used  . Alcohol use 0.0 oz/week     Comment: occ / holidays     Allergies   Norgestimate-eth estradiol and Penicillins   Review of Systems Review of Systems  All other systems reviewed and are negative.    Physical Exam Updated Vital Signs Pulse 82   Temp 98.5 F (36.9 C) (Oral)   Resp 22   LMP 12/11/2015   SpO2 98%   Physical Exam General: Well-developed, morbidly obese female in no acute distress; appearance consistent with age of record HENT: normocephalic; atraumatic Eyes: pupils equal, round and reactive to light; extraocular muscles intact Neck: supple Heart: regular rate and  rhythm Lungs: Persistent coughing; diminished sounds in bases Abdomen: soft; obese; nontender; bowel sounds present Extremities: No deformity; full range of motion Neurologic: Awake, alert and oriented; motor function intact in all extremities and symmetric; no facial droop Skin: Warm and dry Psychiatric: Normal mood and affect    ED Treatments / Results   5:24 AM Cough significantly improved after albuterol and Atrovent neb treatment. The patient does not have an AeroChamber for her inhaler so we will provide one. She states she typically gets bronchitis once a year.  Procedures (including critical care time)  Final Clinical Impressions(s) / ED Diagnoses   Final diagnoses:  Acute bronchitis with bronchospasm       Paula LibraJohn Carrah Eppolito, MD 01/08/16 [phone]

## 2016-01-08 NOTE — ED Triage Notes (Signed)
Dry cough and fever x 4-5 days.

## 2016-01-10 ENCOUNTER — Encounter: Payer: Self-pay | Admitting: Family

## 2016-01-10 ENCOUNTER — Ambulatory Visit (HOSPITAL_COMMUNITY): Payer: Commercial Managed Care - PPO

## 2016-01-10 ENCOUNTER — Ambulatory Visit (INDEPENDENT_AMBULATORY_CARE_PROVIDER_SITE_OTHER): Payer: Commercial Managed Care - PPO | Admitting: Family

## 2016-01-10 VITALS — BP 142/90 | HR 81 | Temp 98.4°F | Resp 17 | Ht 62.0 in | Wt 348.6 lb

## 2016-01-10 DIAGNOSIS — J020 Streptococcal pharyngitis: Secondary | ICD-10-CM | POA: Diagnosis not present

## 2016-01-10 DIAGNOSIS — J029 Acute pharyngitis, unspecified: Secondary | ICD-10-CM

## 2016-01-10 DIAGNOSIS — Z23 Encounter for immunization: Secondary | ICD-10-CM | POA: Diagnosis not present

## 2016-01-10 DIAGNOSIS — J209 Acute bronchitis, unspecified: Secondary | ICD-10-CM | POA: Diagnosis not present

## 2016-01-10 LAB — POCT RAPID STREP A (OFFICE): RAPID STREP A SCREEN: POSITIVE — AB

## 2016-01-10 MED ORDER — CLINDAMYCIN HCL 300 MG PO CAPS: 300.0000 mg | ORAL_CAPSULE | Freq: Three times a day (TID) | ORAL | 0 refills | Status: AC

## 2016-01-10 MED ORDER — PREDNISONE 10 MG PO TABS
ORAL_TABLET | ORAL | 0 refills | Status: AC
Start: 2016-01-10 — End: ?

## 2016-01-10 NOTE — Patient Instructions (Addendum)
Please begin prednisone for asthma.  Continue albuterol every 6 hours and as needed for the next few days.  For Strep, stop zpak, begin clindamycin.    Call if new/worsening symptoms, if you develop recurrent fever, or if symptoms are not improved in 3 days.

## 2016-01-10 NOTE — Progress Notes (Signed)
Pre visit review using our clinic review tool, if applicable. No additional management support is needed unless otherwise documented below in the visit note. 

## 2016-01-10 NOTE — Progress Notes (Signed)
Subjective:    Patient ID: Sheila Donovan, female    DOB: February 28, 1991, 25 y.o.   MRN: [id number]  HPI  Sheila Donovan is a 25  Yr old female who presents today with chief complaint of cough.  She was evaluated in the ED on 8/12 and was diagnosed with acute bronchitis.  ED note is reviewed. Her symptoms improved in the ED after an albuterol/atrovent neb.  She was given an aerochamber.    Patient reports that her cough began on 01/02/16. She reports fever with a tmax of 101.  This fever occurred 4 days ago.  She is on zpak, day 5, and using tussionex. Notes improvement with tussionex, zpak does not seem to be helping.  She reports that she had some left ear pain 2 days ago.  + throat pain.     Review of Systems See HPI  Past Medical History:  Diagnosis Date  . Anemia   . Anxiety   . Arthritis   . Chronic bronchitis (HCC)   . Depression   . History of chicken pox   . History of ear infections   . Migraines   . Morbid obesity (HCC)      Social History   Social History  . Marital status: Single    Spouse name: N/A  . Number of children: N/A  . Years of education: N/A   Occupational History  . Not on file.   Social History Main Topics  . Smoking status: Never Smoker  . Smokeless tobacco: Never Used  . Alcohol use 0.0 oz/week     Comment: occ / holidays  . Drug use: No  . Sexual activity: Not on file   Other Topics Concern  . 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        Past Surgical History:  Procedure Laterality Date  . ADENOIDECTOMY    . CHOLECYSTECTOMY N/A 06/09/2014   Procedure: LAPAROSCOPIC CHOLECYSTECTOMY ;  Surgeon: Emelia LoronMatthew Wakefield, MD;  Location[facility] OR;  Service: General;  Laterality: N/A;  . TONSILLECTOMY    . TYMPANOSTOMY TUBE PLACEMENT      Family History  Problem Relation Age of Onset  . Diabetes Mother   .  Hyperlipidemia Mother   . Hypertension Mother   . Depression Mother   . Alcohol abuse Mother   . Drug abuse Mother   . Depression Father   . Alcohol abuse Father   . Drug abuse Father   . Cancer Maternal Aunt     prostate  . Heart disease Maternal Aunt   . Alcohol abuse Maternal Grandmother   . Hyperlipidemia Maternal Grandmother   . Hypertension Maternal Grandmother   . Hyperlipidemia Paternal Grandmother   . Hyperlipidemia Paternal Grandfather   . Heart attack Neg Hx   . Sudden death Neg Hx     Allergies  Allergen Reactions  . Norgestimate-Eth Estradiol     Weight gain  . Penicillins Hives    Current Outpatient Prescriptions on File Prior to Visit  Medication Sig Dispense Refill  . albuterol (PROVENTIL HFA;VENTOLIN HFA) 108 (90 BASE) MCG/ACT inhaler Inhale 2 puffs into the lungs every 6 (six) hours as needed for wheezing or shortness of breath. 1 Inhaler 2  . azithromycin (ZITHROMAX Z-PAK) 250 MG tablet Take 2 tablets daily for 3 days. 6 tablet 0  .  chlorpheniramine-HYDROcodone (TUSSIONEX PENNKINETIC ER) 10-8 MG/5ML SUER Take 5 mLs by mouth every 12 (twelve) hours as needed for cough. 70 mL 0  . ferrous sulfate (FER-IN-SOL) 75 (15 FE) MG/ML SOLN Take by mouth. Unknown dose    . omeprazole (PRILOSEC) 20 MG capsule Take 1 capsule (20 mg total) by mouth daily. Reported on 11/16/2015 30 capsule 5  . SUMAtriptan (IMITREX) 50 MG tablet Take 1 tablet (50 mg total) by mouth every 2 (two) hours as needed. May repeat in 2 hours if headache persists or recurs. (max 2 tabs/24hrs) 10 tablet 5  . colestipol (COLESTID) 1 g tablet Take 1 tablet (1 g total) by mouth 2 (two) times daily. (Patient not taking: Reported on 01/10/2016) 60 tablet 2   No current facility-administered medications on file prior to visit.     BP (!) 142/90 (BP Location: Left Arm, Patient Position: Sitting, Cuff Size: Large)   Pulse 81   Temp 98.4 F (36.9 C) (Oral)   Resp 17   Ht 5\' 2"  (1.575 m)   Wt (!) 348 lb  9.6 oz (158.1 kg)   LMP 01/04/2016 (Exact Date)   SpO2 97%   BMI 63.76 kg/m       Objective:   Physical Exam  Constitutional: She is oriented to person, place, and time. She appears well-developed and well-nourished.  HENT:  Head: Normocephalic and atraumatic.  Right Ear: Tympanic membrane and ear canal normal.  Left Ear: Tympanic membrane and ear canal normal.  + voice hoarseness noted. Difficulty seeing posterior oropharynx   Cardiovascular: Normal rate, regular rhythm and normal heart sounds.   No murmur heard. Pulmonary/Chest: Effort normal. She has wheezes.  Musculoskeletal: She exhibits no edema.  Neurological: She is alert and oriented to person, place, and time.  Skin: Skin is warm and dry.  Psychiatric: She has a normal mood and affect. Her behavior is normal. Judgment and thought content normal.          Assessment & Plan:  Bronchitis with bronchospasm- will rx a pred taper. Pt is advised to continue albuterol, tussionex.    Rapid Strep +- has hives to penicillin. Failing zithromax. Will rx with clindamycin. Pt is advised as follows:   Call if new/worsening symptoms, if you develop recurrent fever, or if symptoms are not improved in 3 days.

## 2016-02-19 IMAGING — CR DG KNEE COMPLETE 4+V*L*
4 series · 4 of 4 positions shown · non-contrast
Comparison: None.

CLINICAL DATA: Fall on concrete 1 week prior injuring left knee.
Persistent and worsening left knee pain. Unable to bear weight.

EXAM:
LEFT KNEE - COMPLETE 4+ VIEW

[t knee ap left]
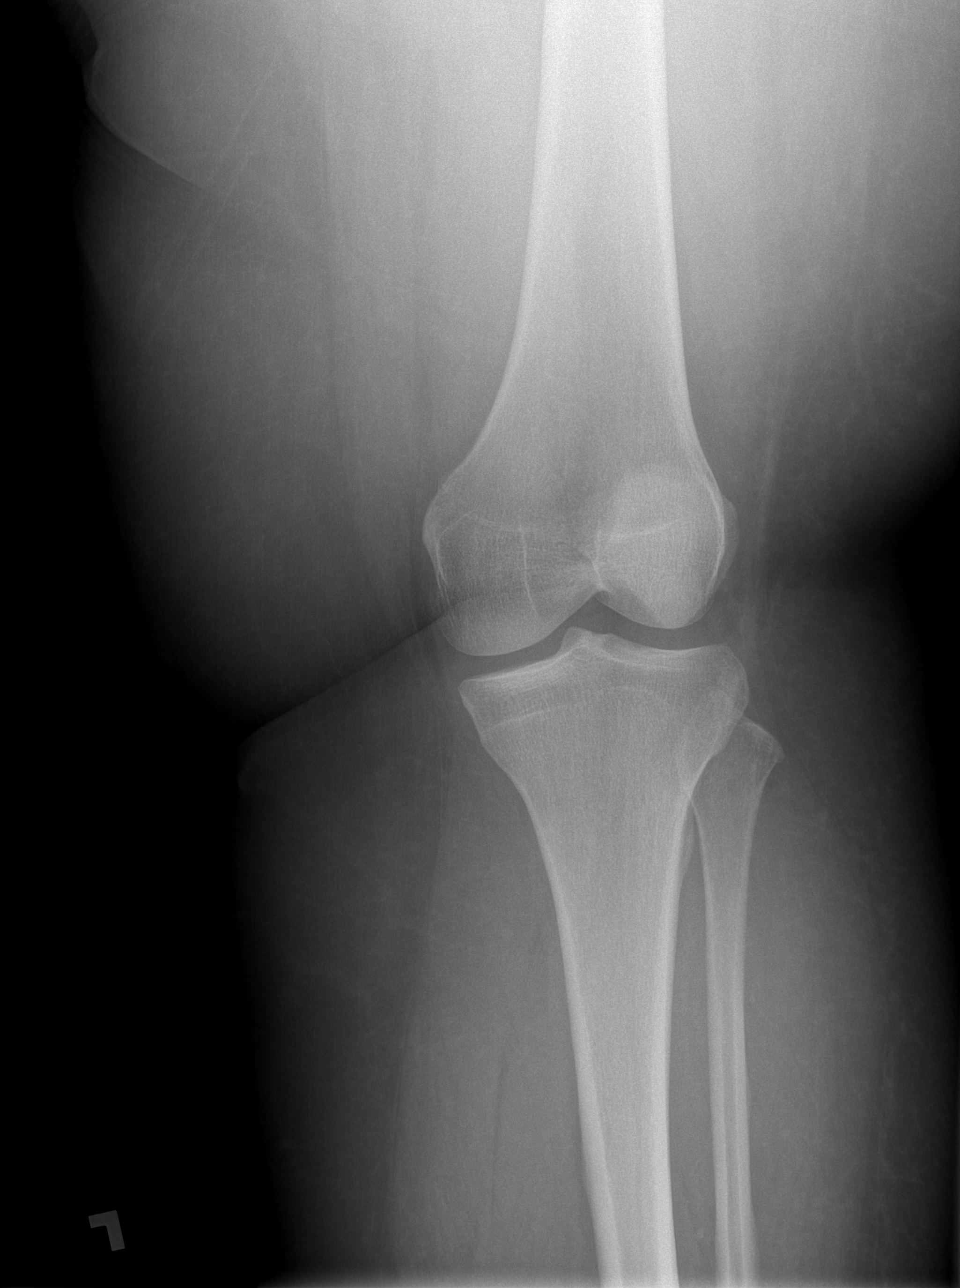

[t knee oblique left (1 of 2)]
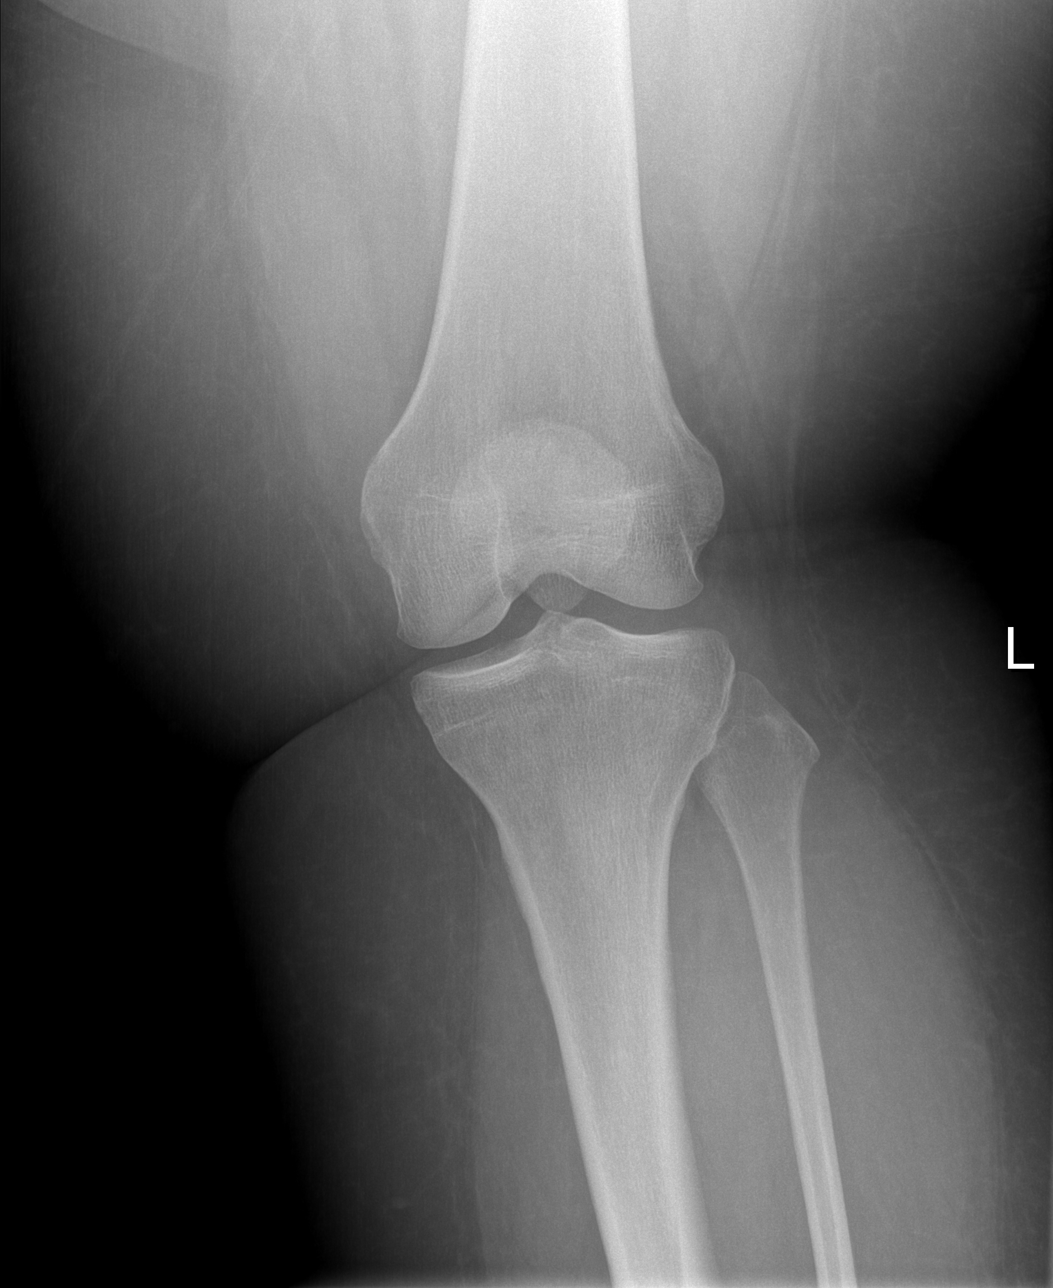

[t knee oblique left (2 of 2)]
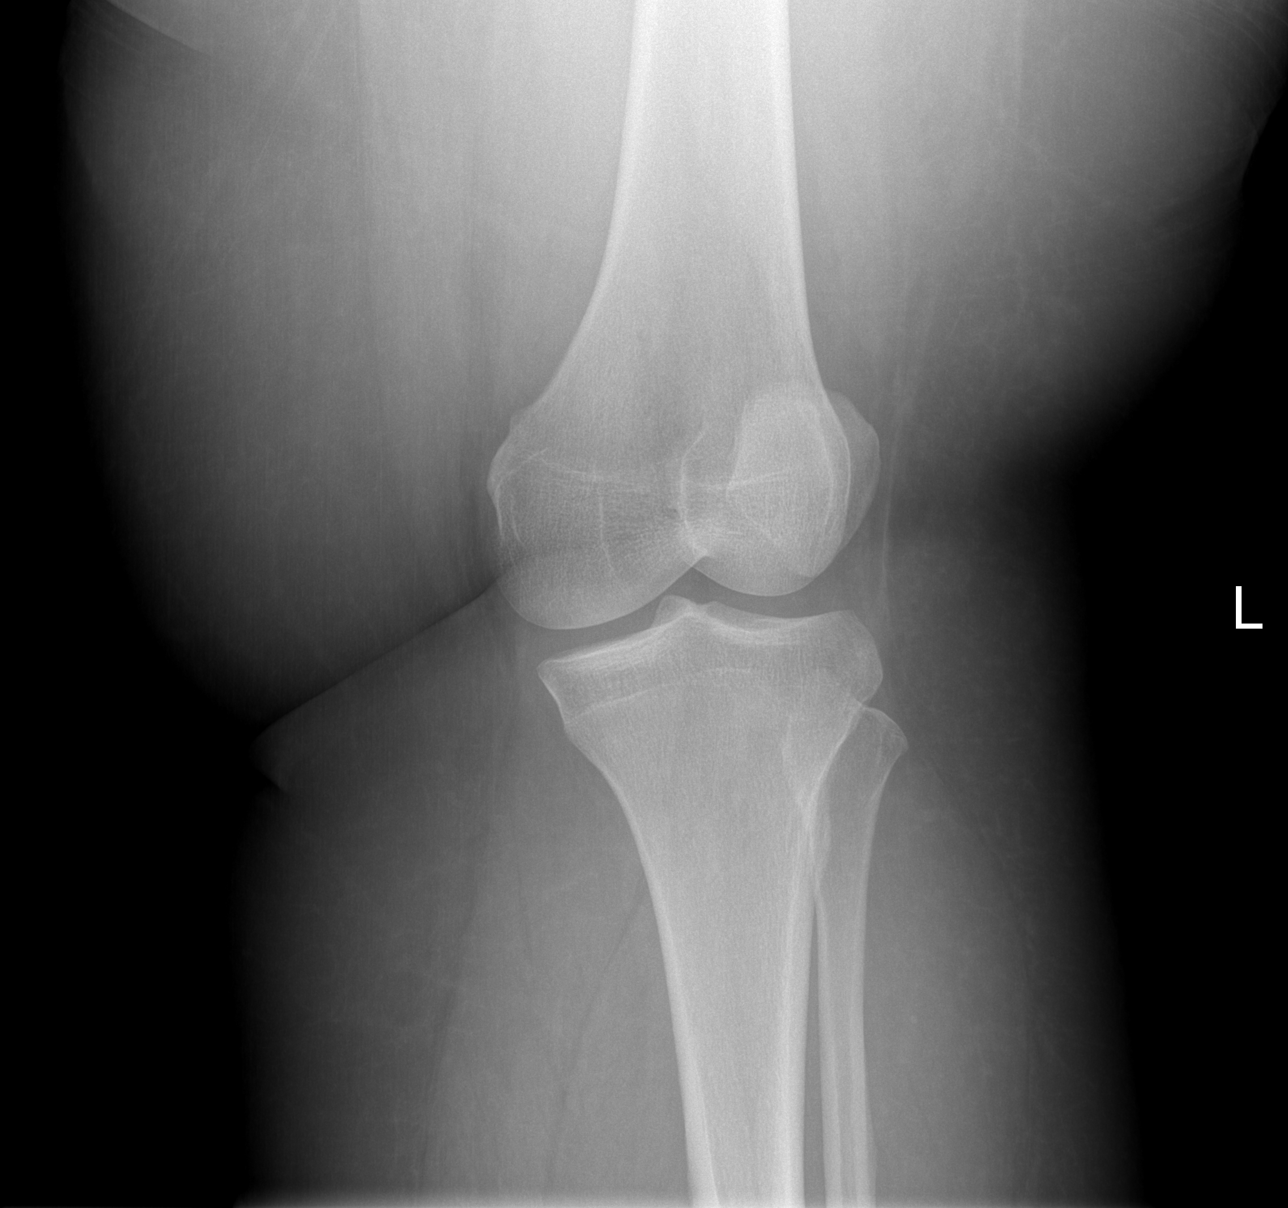

[t knee lat left]
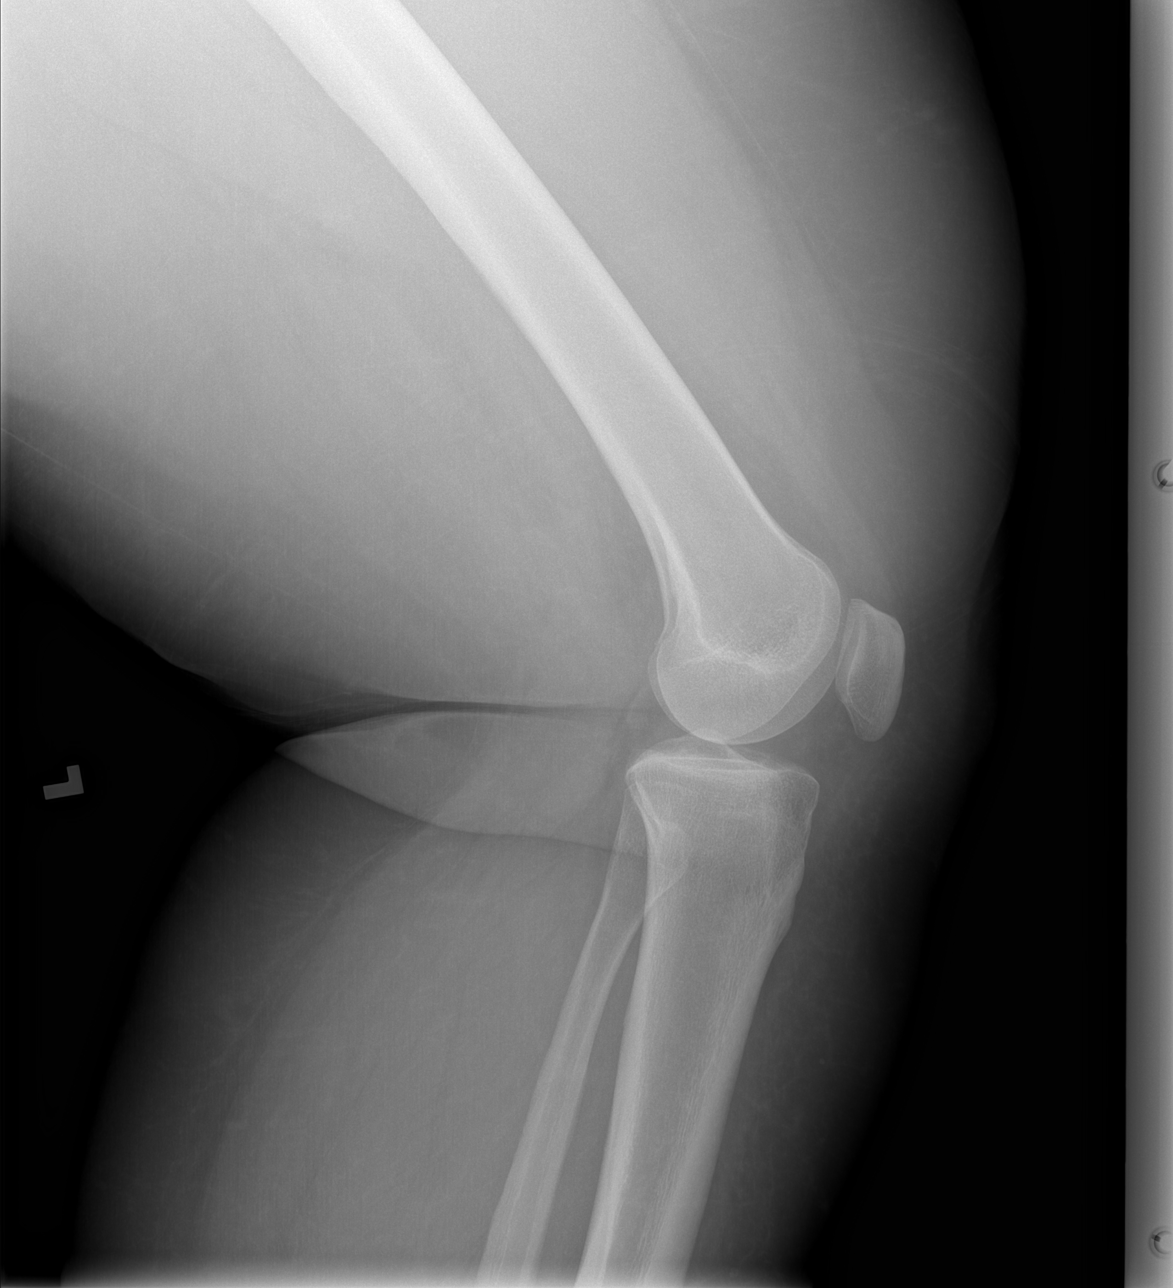

[4 of 4 positions shown; findings below may reference images not displayed]

FINDINGS: No fracture or dislocation. The alignment and joint spaces are
maintained. There is no joint effusion. No focal soft tissue
abnormality.
IMPRESSION: No fracture or dislocation of the left knee.

## 2016-03-17 ENCOUNTER — Ambulatory Visit: Payer: Commercial Managed Care - PPO | Admitting: Family

## 2016-04-17 IMAGING — DX DG CHEST 2V
2 series · 2 of 2 positions shown · non-contrast
Comparison: PA and lateral chest x-ray August 19, 2013

CLINICAL DATA: Onset of chest pain and tightness today with
numbness in the left fingers; history of chronic bronchitis, asthma,
and morbid obesity. Nonsmoker.

EXAM:
CHEST  2 VIEW

[chest pa]
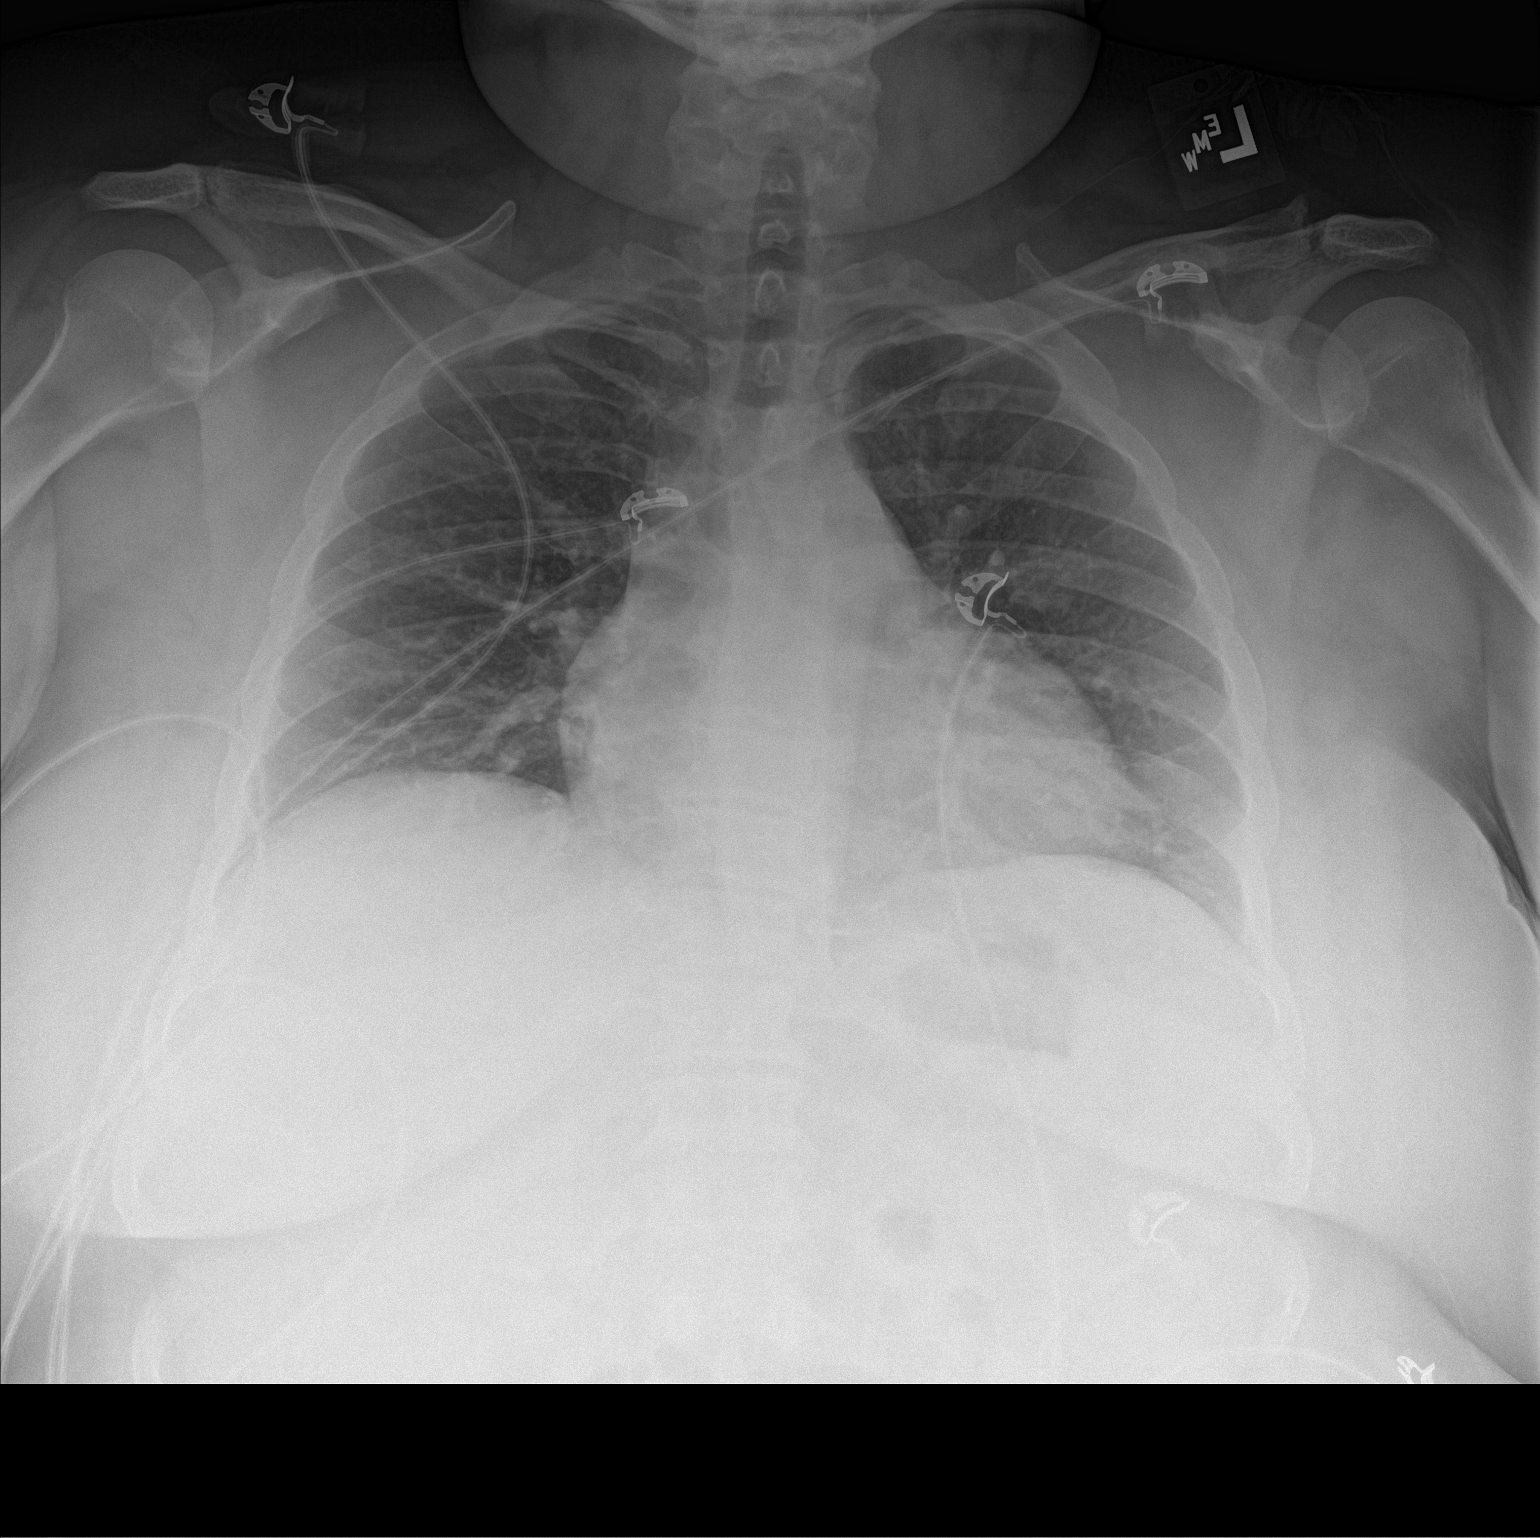

[chest lat]
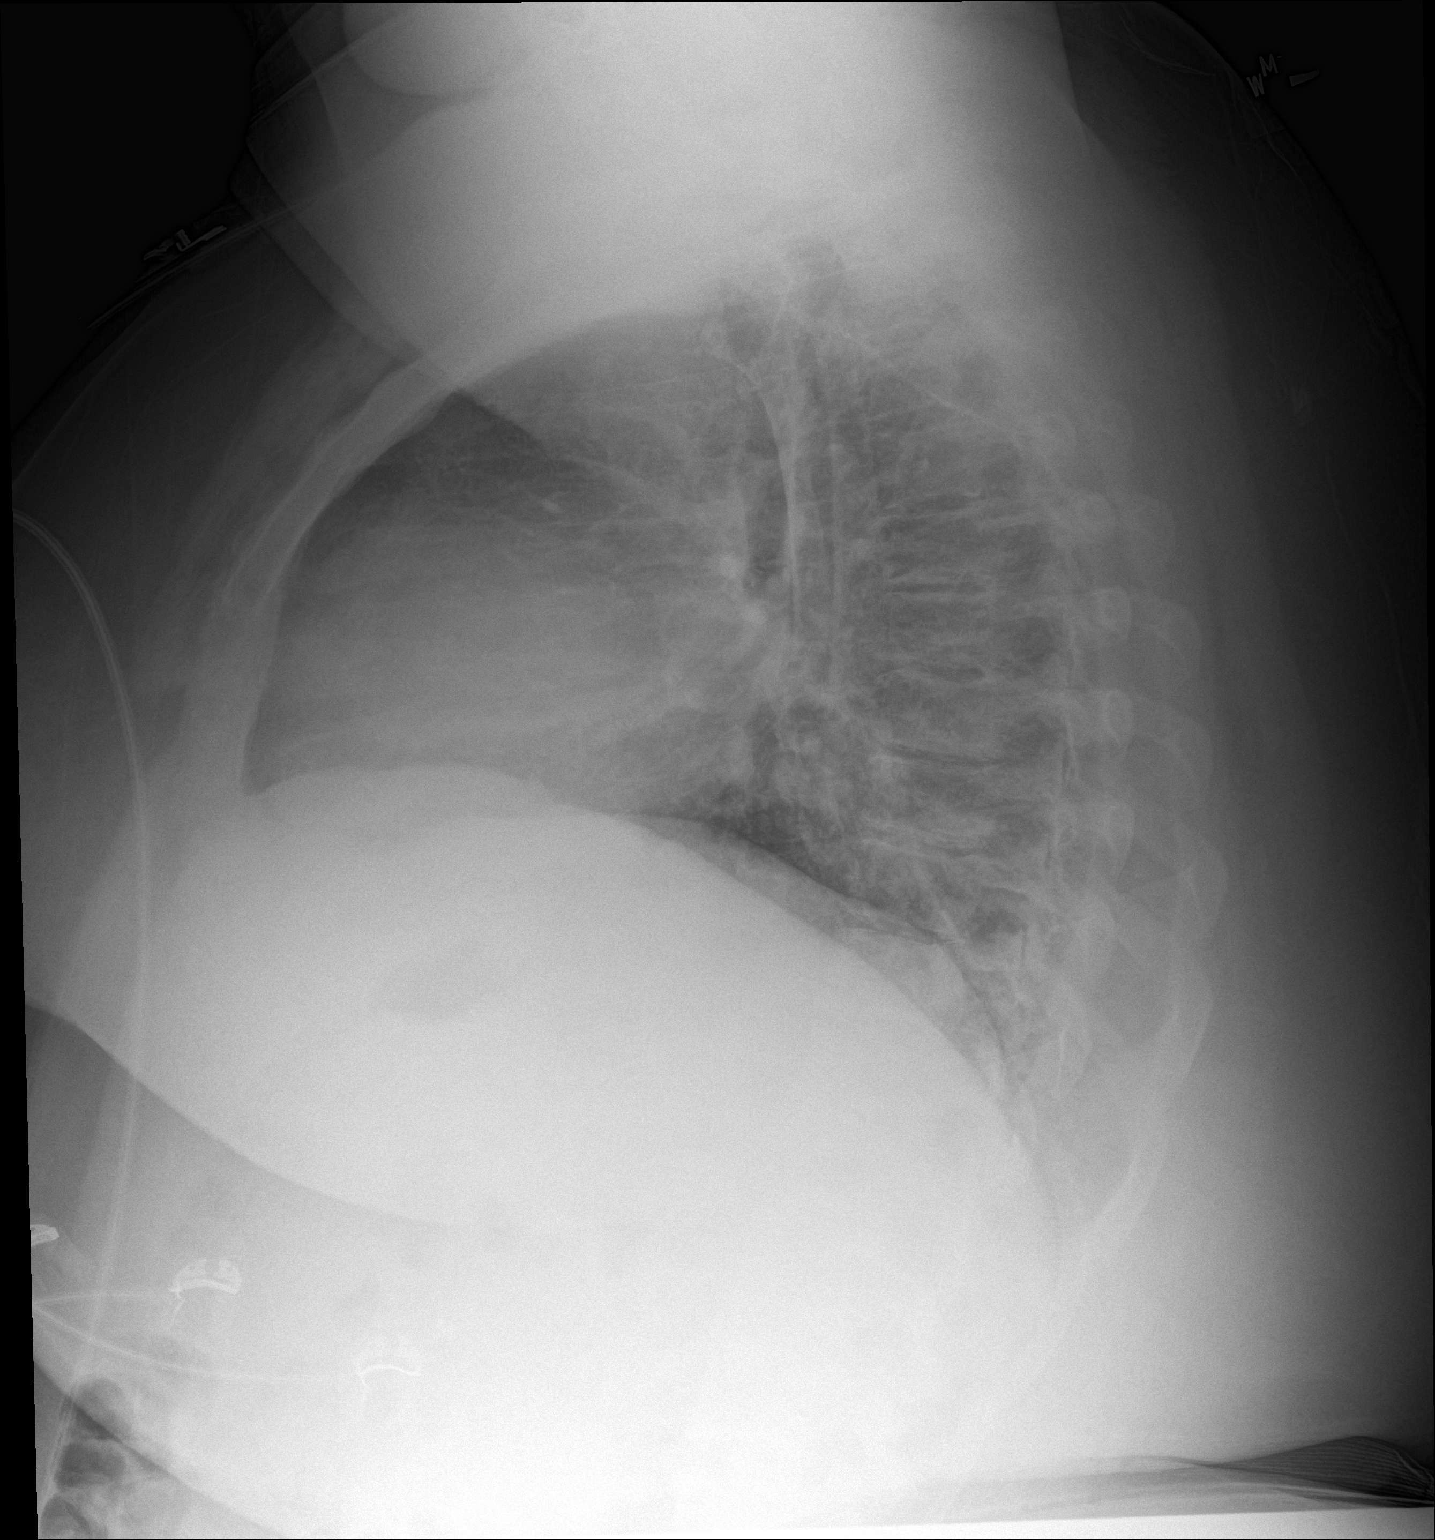

[2 of 2 positions shown; findings below may reference images not displayed]

FINDINGS: The lungs remain hypoinflated. The interstitial markings are coarse
and more conspicuous than on the previous study. There is no
alveolar infiltrate. There is no pleural effusion or pneumothorax.
The cardiac silhouette is top-normal in size. The mediastinum is
normal in width. There is no pleural effusion. There is mild
multilevel degenerative disc space narrowing of the thoracic spine.
IMPRESSION: Stable hypoinflation. Mild interstitial prominence bilaterally
likely reflects acute bronchitic change superimposed upon chronic
reactive airway disease. There is no alveolar pneumonia nor
pulmonary edema.
